# Patient Record
Sex: Male | Born: 1960
Health system: Southern US, Community
[De-identification: ages and names within clinical notes are randomized; demographics above are authoritative.]

## PROBLEM LIST (undated history)

## (undated) DIAGNOSIS — I10 Essential (primary) hypertension: Secondary | ICD-10-CM

## (undated) HISTORY — DX: Essential (primary) hypertension: I10

## (undated) HISTORY — PX: APPENDECTOMY: SHX54

## (undated) HISTORY — PX: HEMORRHOID SURGERY: SHX153

## (undated) HISTORY — PX: INGUINAL HERNIA REPAIR: SUR1180

---

## 2015-11-02 DIAGNOSIS — H43811 Vitreous degeneration, right eye: Secondary | ICD-10-CM | POA: Diagnosis not present

## 2016-01-07 DIAGNOSIS — R5383 Other fatigue: Secondary | ICD-10-CM | POA: Diagnosis not present

## 2016-01-07 DIAGNOSIS — Z23 Encounter for immunization: Secondary | ICD-10-CM | POA: Diagnosis not present

## 2016-01-07 DIAGNOSIS — E785 Hyperlipidemia, unspecified: Secondary | ICD-10-CM | POA: Diagnosis not present

## 2016-01-07 DIAGNOSIS — I10 Essential (primary) hypertension: Secondary | ICD-10-CM | POA: Diagnosis not present

## 2016-01-07 DIAGNOSIS — Z125 Encounter for screening for malignant neoplasm of prostate: Secondary | ICD-10-CM | POA: Diagnosis not present

## 2016-06-21 DIAGNOSIS — Z1389 Encounter for screening for other disorder: Secondary | ICD-10-CM | POA: Diagnosis not present

## 2016-06-21 DIAGNOSIS — N419 Inflammatory disease of prostate, unspecified: Secondary | ICD-10-CM | POA: Diagnosis not present

## 2016-08-10 DIAGNOSIS — L219 Seborrheic dermatitis, unspecified: Secondary | ICD-10-CM | POA: Diagnosis not present

## 2016-10-12 DIAGNOSIS — K649 Unspecified hemorrhoids: Secondary | ICD-10-CM | POA: Diagnosis not present

## 2016-10-12 DIAGNOSIS — K644 Residual hemorrhoidal skin tags: Secondary | ICD-10-CM | POA: Diagnosis not present

## 2016-10-12 DIAGNOSIS — K625 Hemorrhage of anus and rectum: Secondary | ICD-10-CM | POA: Diagnosis not present

## 2016-10-12 DIAGNOSIS — K648 Other hemorrhoids: Secondary | ICD-10-CM | POA: Diagnosis not present

## 2016-12-29 DIAGNOSIS — Z23 Encounter for immunization: Secondary | ICD-10-CM | POA: Diagnosis not present

## 2017-01-12 DIAGNOSIS — J029 Acute pharyngitis, unspecified: Secondary | ICD-10-CM | POA: Diagnosis not present

## 2017-01-12 DIAGNOSIS — Z6827 Body mass index (BMI) 27.0-27.9, adult: Secondary | ICD-10-CM | POA: Diagnosis not present

## 2017-01-30 DIAGNOSIS — Z1339 Encounter for screening examination for other mental health and behavioral disorders: Secondary | ICD-10-CM | POA: Diagnosis not present

## 2017-01-30 DIAGNOSIS — Z125 Encounter for screening for malignant neoplasm of prostate: Secondary | ICD-10-CM | POA: Diagnosis not present

## 2017-01-30 DIAGNOSIS — R5383 Other fatigue: Secondary | ICD-10-CM | POA: Diagnosis not present

## 2017-01-30 DIAGNOSIS — Z6827 Body mass index (BMI) 27.0-27.9, adult: Secondary | ICD-10-CM | POA: Diagnosis not present

## 2017-02-09 DIAGNOSIS — J029 Acute pharyngitis, unspecified: Secondary | ICD-10-CM | POA: Diagnosis not present

## 2017-02-09 DIAGNOSIS — Z6827 Body mass index (BMI) 27.0-27.9, adult: Secondary | ICD-10-CM | POA: Diagnosis not present

## 2017-04-27 DIAGNOSIS — R42 Dizziness and giddiness: Secondary | ICD-10-CM | POA: Diagnosis not present

## 2017-04-27 DIAGNOSIS — R531 Weakness: Secondary | ICD-10-CM | POA: Diagnosis not present

## 2017-07-13 DIAGNOSIS — Z1331 Encounter for screening for depression: Secondary | ICD-10-CM | POA: Diagnosis not present

## 2017-07-13 DIAGNOSIS — Z6827 Body mass index (BMI) 27.0-27.9, adult: Secondary | ICD-10-CM | POA: Diagnosis not present

## 2018-01-15 DIAGNOSIS — Z0000 Encounter for general adult medical examination without abnormal findings: Secondary | ICD-10-CM | POA: Diagnosis not present

## 2018-01-15 DIAGNOSIS — Z125 Encounter for screening for malignant neoplasm of prostate: Secondary | ICD-10-CM | POA: Diagnosis not present

## 2018-01-15 DIAGNOSIS — Z13220 Encounter for screening for lipoid disorders: Secondary | ICD-10-CM | POA: Diagnosis not present

## 2018-01-15 DIAGNOSIS — Z6827 Body mass index (BMI) 27.0-27.9, adult: Secondary | ICD-10-CM | POA: Diagnosis not present

## 2018-01-25 ENCOUNTER — Other Ambulatory Visit (HOSPITAL_COMMUNITY): Payer: Self-pay | Admitting: Family Medicine

## 2018-01-25 ENCOUNTER — Other Ambulatory Visit: Payer: Self-pay | Admitting: Family Medicine

## 2018-01-25 DIAGNOSIS — R079 Chest pain, unspecified: Secondary | ICD-10-CM

## 2018-03-19 DIAGNOSIS — Z6827 Body mass index (BMI) 27.0-27.9, adult: Secondary | ICD-10-CM | POA: Diagnosis not present

## 2018-03-19 DIAGNOSIS — B356 Tinea cruris: Secondary | ICD-10-CM | POA: Diagnosis not present

## 2018-04-05 DIAGNOSIS — B356 Tinea cruris: Secondary | ICD-10-CM | POA: Diagnosis not present

## 2018-04-05 DIAGNOSIS — Z6827 Body mass index (BMI) 27.0-27.9, adult: Secondary | ICD-10-CM | POA: Diagnosis not present

## 2018-06-28 DIAGNOSIS — R1013 Epigastric pain: Secondary | ICD-10-CM | POA: Diagnosis not present

## 2018-06-28 DIAGNOSIS — Z6826 Body mass index (BMI) 26.0-26.9, adult: Secondary | ICD-10-CM | POA: Diagnosis not present

## 2018-06-28 DIAGNOSIS — I10 Essential (primary) hypertension: Secondary | ICD-10-CM | POA: Diagnosis not present

## 2018-10-11 DIAGNOSIS — Z6827 Body mass index (BMI) 27.0-27.9, adult: Secondary | ICD-10-CM | POA: Diagnosis not present

## 2018-10-11 DIAGNOSIS — M545 Low back pain: Secondary | ICD-10-CM | POA: Diagnosis not present

## 2018-10-11 DIAGNOSIS — I10 Essential (primary) hypertension: Secondary | ICD-10-CM | POA: Diagnosis not present

## 2018-11-06 DIAGNOSIS — Z6827 Body mass index (BMI) 27.0-27.9, adult: Secondary | ICD-10-CM | POA: Diagnosis not present

## 2018-11-06 DIAGNOSIS — M954 Acquired deformity of chest and rib: Secondary | ICD-10-CM | POA: Diagnosis not present

## 2018-11-06 DIAGNOSIS — G8929 Other chronic pain: Secondary | ICD-10-CM | POA: Diagnosis not present

## 2018-11-06 DIAGNOSIS — Z1331 Encounter for screening for depression: Secondary | ICD-10-CM | POA: Diagnosis not present

## 2018-11-06 DIAGNOSIS — M549 Dorsalgia, unspecified: Secondary | ICD-10-CM | POA: Diagnosis not present

## 2018-11-15 DIAGNOSIS — M549 Dorsalgia, unspecified: Secondary | ICD-10-CM | POA: Diagnosis not present

## 2018-11-15 DIAGNOSIS — M2560 Stiffness of unspecified joint, not elsewhere classified: Secondary | ICD-10-CM | POA: Diagnosis not present

## 2018-11-19 DIAGNOSIS — M2560 Stiffness of unspecified joint, not elsewhere classified: Secondary | ICD-10-CM | POA: Diagnosis not present

## 2018-11-19 DIAGNOSIS — M549 Dorsalgia, unspecified: Secondary | ICD-10-CM | POA: Diagnosis not present

## 2019-01-04 DIAGNOSIS — Z6827 Body mass index (BMI) 27.0-27.9, adult: Secondary | ICD-10-CM | POA: Diagnosis not present

## 2019-01-04 DIAGNOSIS — G8929 Other chronic pain: Secondary | ICD-10-CM | POA: Diagnosis not present

## 2019-01-04 DIAGNOSIS — M549 Dorsalgia, unspecified: Secondary | ICD-10-CM | POA: Diagnosis not present

## 2019-01-18 DIAGNOSIS — Z23 Encounter for immunization: Secondary | ICD-10-CM | POA: Diagnosis not present

## 2019-01-29 DIAGNOSIS — M545 Low back pain: Secondary | ICD-10-CM | POA: Diagnosis not present

## 2019-02-07 DIAGNOSIS — M545 Low back pain: Secondary | ICD-10-CM | POA: Diagnosis not present

## 2019-02-07 DIAGNOSIS — M6281 Muscle weakness (generalized): Secondary | ICD-10-CM | POA: Diagnosis not present

## 2019-02-12 DIAGNOSIS — R079 Chest pain, unspecified: Secondary | ICD-10-CM | POA: Diagnosis not present

## 2019-02-12 DIAGNOSIS — J00 Acute nasopharyngitis [common cold]: Secondary | ICD-10-CM | POA: Diagnosis not present

## 2019-02-12 DIAGNOSIS — Z6828 Body mass index (BMI) 28.0-28.9, adult: Secondary | ICD-10-CM | POA: Diagnosis not present

## 2019-02-21 DIAGNOSIS — M545 Low back pain: Secondary | ICD-10-CM | POA: Diagnosis not present

## 2019-02-21 DIAGNOSIS — M6281 Muscle weakness (generalized): Secondary | ICD-10-CM | POA: Diagnosis not present

## 2019-03-01 DIAGNOSIS — Z125 Encounter for screening for malignant neoplasm of prostate: Secondary | ICD-10-CM | POA: Diagnosis not present

## 2019-03-01 DIAGNOSIS — K921 Melena: Secondary | ICD-10-CM | POA: Diagnosis not present

## 2019-03-01 DIAGNOSIS — R3915 Urgency of urination: Secondary | ICD-10-CM | POA: Diagnosis not present

## 2019-03-01 DIAGNOSIS — Z1331 Encounter for screening for depression: Secondary | ICD-10-CM | POA: Diagnosis not present

## 2019-03-01 DIAGNOSIS — Z79899 Other long term (current) drug therapy: Secondary | ICD-10-CM | POA: Diagnosis not present

## 2019-03-01 DIAGNOSIS — Z6828 Body mass index (BMI) 28.0-28.9, adult: Secondary | ICD-10-CM | POA: Diagnosis not present

## 2019-03-01 DIAGNOSIS — E7800 Pure hypercholesterolemia, unspecified: Secondary | ICD-10-CM | POA: Diagnosis not present

## 2019-03-07 DIAGNOSIS — M6281 Muscle weakness (generalized): Secondary | ICD-10-CM | POA: Diagnosis not present

## 2019-03-07 DIAGNOSIS — M545 Low back pain: Secondary | ICD-10-CM | POA: Diagnosis not present

## 2019-03-15 DIAGNOSIS — K625 Hemorrhage of anus and rectum: Secondary | ICD-10-CM | POA: Diagnosis not present

## 2019-03-29 ENCOUNTER — Ambulatory Visit (INDEPENDENT_AMBULATORY_CARE_PROVIDER_SITE_OTHER): Payer: BC Managed Care – PPO | Admitting: Cardiology

## 2019-03-29 ENCOUNTER — Encounter: Payer: Self-pay | Admitting: Cardiology

## 2019-03-29 ENCOUNTER — Other Ambulatory Visit: Payer: Self-pay

## 2019-03-29 DIAGNOSIS — R0789 Other chest pain: Secondary | ICD-10-CM | POA: Diagnosis not present

## 2019-03-29 DIAGNOSIS — R079 Chest pain, unspecified: Secondary | ICD-10-CM

## 2019-03-29 DIAGNOSIS — I10 Essential (primary) hypertension: Secondary | ICD-10-CM | POA: Insufficient documentation

## 2019-03-29 MED ORDER — NITROGLYCERIN 0.4 MG SL SUBL: 0.4000 mg | SUBLINGUAL_TABLET | SUBLINGUAL | 6 refills | Status: DC | PRN

## 2019-03-29 NOTE — Progress Notes (Signed)
Cardiology Office Note:    Date:  03/29/2019   ID:  Brandon Love, DOB [birth year], MRN [id number]  PCP:  Noni Saupe, MD  Cardiologist:  Garwin Brothers, MD   Referring MD: Noni Saupe, MD    ASSESSMENT:    1. Chest discomfort   2. Essential hypertension    PLAN:    In order of problems listed above:  1. Chest discomfort: I discussed my findings with the patient at extensive length.  They are atypical for coronary etiology however in view of risk factors I discussed with him the following.  Sublingual nitroglycerin prescription was sent, its protocol and 911 protocol explained and the patient vocalized understanding questions were answered to the patient's satisfaction.  In view of the symptoms I will also do a Lexiscan sestamibi.  Procedure, benefits and risks explained to the patient and he vocalized understanding and is agreeable. 2. Essential hypertension: Blood pressure is stable and he is monitored closely by primary care physician.  I reviewed primary care records extensively and his lipids are unremarkable. 3. Patient will be seen in follow-up appointment in 6 months or earlier if the patient has any concerns.  He knows to go to nearest emergency room for any concerning symptoms.   Medication Adjustments/Labs and Tests Ordered: Current medicines are reviewed at length with the patient today.  Concerns regarding medicines are outlined above.  No orders of the defined types were placed in this encounter.  No orders of the defined types were placed in this encounter.    History of Present Illness:    Brandon Love is a 58 y.o. male who is being seen today for the evaluation of chest discomfort at the request of Noni Saupe, MD.  Patient is a pleasant 58 year old male.  He has past medical history of essential hypertension.  The patient mentions to me that occasionally has chest discomfort and this is not related to exertion.  It is more of stabbing-like  nature no radiation to the neck or to the arms.  He works full-time.  He does not exercise on a regular basis.  He is not sexually active.  Therefore history of exertional induced symptoms is difficult to elicit.  No orthopnea or PND.  At the time of my evaluation, the patient is alert awake oriented and in no distress.  No past medical history on file.   Current Medications: Current Meds  Medication Sig  . fluticasone (FLONASE) 50 MCG/ACT nasal spray Place 2 sprays into both nostrils daily.  . Garlic 100 MG TABS Take by mouth.  Marland Kitchen lisinopril (ZESTRIL) 20 MG tablet Take 20 mg by mouth daily.  . magnesium oxide (MAG-OX) 400 MG tablet Take 400 mg by mouth daily.  . Omega-3 Fatty Acids (FISH OIL) 1000 MG CAPS Take 1 capsule by mouth.     Allergies:   Codeine   Social History   Socioeconomic History  . Marital status: Unknown    Spouse name: Not on file  . Number of children: Not on file  . Years of education: Not on file  . Highest education level: Not on file  Occupational History  . Not on file  Tobacco Use  . Smoking status: Never Smoker  . Smokeless tobacco: Never Used  Substance and Sexual Activity  . Alcohol use: Not Currently  . Drug use: Never  . Sexual activity: Not on file  Other Topics Concern  . Not on file  Social History  Narrative  . Not on file   Social Determinants of Health   Financial Resource Strain:   . Difficulty of Paying Living Expenses: Not on file  Food Insecurity:   . Worried About Charity fundraiser in the Last Year: Not on file  . Ran Out of Food in the Last Year: Not on file  Transportation Needs:   . Lack of Transportation (Medical): Not on file  . Lack of Transportation (Non-Medical): Not on file  Physical Activity:   . Days of Exercise per Week: Not on file  . Minutes of Exercise per Session: Not on file  Stress:   . Feeling of Stress : Not on file  Social Connections:   . Frequency of Communication with Friends and Family: Not on  file  . Frequency of Social Gatherings with Friends and Family: Not on file  . Attends Religious Services: Not on file  . Active Member of Clubs or Organizations: Not on file  . Attends Archivist Meetings: Not on file  . Marital Status: Not on file     Family History: The patient's family history includes Headache in his mother; Prostate cancer in his father.  ROS:   Please see the history of present illness.    All other systems reviewed and are negative.  EKGs/Labs/Other Studies Reviewed:    The following studies were reviewed today: EKG reveals sinus rhythm and nonspecific ST-T changes.   Recent Labs: No results found for requested labs within last 8760 hours.  Recent Lipid Panel No results found for: CHOL, TRIG, HDL, CHOLHDL, VLDL, LDLCALC, LDLDIRECT  Physical Exam:    VS:  BP 140/72   Pulse 76   Ht 5\' 9"  (1.753 m)   Wt 191 lb (86.6 kg)   SpO2 98%   BMI 28.21 kg/m     Wt Readings from Last 3 Encounters:  03/29/19 191 lb (86.6 kg)     GEN: Patient is in no acute distress HEENT: Normal NECK: No JVD; No carotid bruits LYMPHATICS: No lymphadenopathy CARDIAC: S1 S2 regular, 2/6 systolic murmur at the apex. RESPIRATORY:  Clear to auscultation without rales, wheezing or rhonchi  ABDOMEN: Soft, non-tender, non-distended MUSCULOSKELETAL:  No edema; No deformity  SKIN: Warm and dry NEUROLOGIC:  Alert and oriented x 3 PSYCHIATRIC:  Normal affect    Signed, Jenean Lindau, MD  03/29/2019 2:29 PM    [place]

## 2019-03-29 NOTE — Addendum Note (Signed)
Addended by: Aleatha Borer on: 03/29/2019 02:40 PM   Modules accepted: Orders

## 2019-03-29 NOTE — Addendum Note (Signed)
Addended by: Ashok Norris on: 03/29/2019 02:48 PM   Modules accepted: Orders

## 2019-03-29 NOTE — Patient Instructions (Signed)
Medication Instructions:  Your physician has recommended you make the following change in your medication:  Nitroglycerine 0.4 mg under the tongue as needed for chest pain. Take 1 tablet every 15 minutes up to 3 tablets if pain continues. If pain continues after 3rd tablet call 911 and proceed to the nearest ER.  *If you need a refill on your cardiac medications before your next appointment, please call your pharmacy*  Lab Work: None ordered If you have labs (blood work) drawn today and your tests are completely normal, you will receive your results only by: Marland Kitchen MyChart Message (if you have MyChart) OR . A paper copy in the mail If you have any lab test that is abnormal or we need to change your treatment, we will call you to review the results.  Testing/Procedures: Your physician has requested that you have a lexiscan myoview. For further information please visit [email or web address]. Please follow instruction sheet, as given.  Follow-Up: At [facility], you and your health needs are our priority.  As part of our continuing mission to provide you with exceptional heart care, we have created designated Provider Care Teams.  These Care Teams include your primary Cardiologist (physician) and Advanced Practice Providers (APPs -  Physician Assistants and Nurse Practitioners) who all work together to provide you with the care you need, when you need it.  Your next appointment:   6 month(s)  The format for your next appointment:   In Person  Provider:   You will see  Sunny Schlein Revankar.  Or, you can be scheduled with the following Advanced Practice Provider on your designated Care Team (at our [facility]):  Laurann Montana, FNP

## 2019-04-02 ENCOUNTER — Telehealth (HOSPITAL_COMMUNITY): Payer: Self-pay | Admitting: *Deleted

## 2019-04-02 NOTE — Telephone Encounter (Signed)
Left message on voicemail in reference to upcoming appointment scheduled for 04/03/19. Phone number given for a call back so details instructions can be given.  Brandon Love Jacqueline   

## 2019-04-03 ENCOUNTER — Ambulatory Visit (INDEPENDENT_AMBULATORY_CARE_PROVIDER_SITE_OTHER): Payer: BC Managed Care – PPO

## 2019-04-03 ENCOUNTER — Other Ambulatory Visit: Payer: Self-pay

## 2019-04-03 VITALS — Ht 69.0 in | Wt 191.0 lb

## 2019-04-03 DIAGNOSIS — R079 Chest pain, unspecified: Secondary | ICD-10-CM | POA: Diagnosis not present

## 2019-04-03 DIAGNOSIS — R0789 Other chest pain: Secondary | ICD-10-CM

## 2019-04-03 DIAGNOSIS — I10 Essential (primary) hypertension: Secondary | ICD-10-CM

## 2019-04-03 LAB — MYOCARDIAL PERFUSION IMAGING
LV dias vol: 93 mL (ref 62–150)
LV sys vol: 36 mL
Peak HR: 99 {beats}/min
Rest HR: 70 {beats}/min
SDS: 0
SRS: 0
SSS: 0
TID: 1.04

## 2019-04-03 MED ORDER — TECHNETIUM TC 99M TETROFOSMIN IV KIT
10.6000 | PACK | Freq: Once | INTRAVENOUS | Status: AC | PRN
  Administered 2019-04-03: 10.6 via INTRAVENOUS

## 2019-04-03 MED ORDER — TECHNETIUM TC 99M TETROFOSMIN IV KIT
32.8000 | PACK | Freq: Once | INTRAVENOUS | Status: AC | PRN
  Administered 2019-04-03: 32.8 via INTRAVENOUS

## 2019-04-03 MED ORDER — REGADENOSON 0.4 MG/5ML IV SOLN
0.4000 mg | Freq: Once | INTRAVENOUS | Status: AC
  Administered 2019-04-03: 0.4 mg via INTRAVENOUS

## 2019-04-05 ENCOUNTER — Telehealth: Payer: Self-pay | Admitting: *Deleted

## 2019-04-05 NOTE — Telephone Encounter (Signed)
Telephone call to patient. Made appointment for 1st available 04/24/19.Patient wants [place] only. Nitro called in to pharmacy.

## 2019-04-05 NOTE — Telephone Encounter (Signed)
-----   Message from Jenean Lindau, MD sent at 04/03/2019  5:03 PM EST ----- Stress test is abnormal.  Please make an appointment for patient to see me in the next few days.  Make sure patient has nitroglycerin prescription. Jenean Lindau, MD 04/03/2019 5:02 PM

## 2019-04-09 ENCOUNTER — Other Ambulatory Visit: Payer: Self-pay

## 2019-04-09 ENCOUNTER — Ambulatory Visit (HOSPITAL_BASED_OUTPATIENT_CLINIC_OR_DEPARTMENT_OTHER)
Admission: RE | Admit: 2019-04-09 | Discharge: 2019-04-09 | Disposition: A | Discharge status: Home / Self Care | Payer: BC Managed Care – PPO | Source: Ambulatory Visit | Attending: Cardiology | Admitting: Cardiology

## 2019-04-09 ENCOUNTER — Ambulatory Visit (INDEPENDENT_AMBULATORY_CARE_PROVIDER_SITE_OTHER): Payer: BC Managed Care – PPO | Admitting: Cardiology

## 2019-04-09 ENCOUNTER — Encounter: Payer: Self-pay | Admitting: Cardiology

## 2019-04-09 VITALS — BP 128/78 | HR 82 | Ht 69.0 in | Wt 192.0 lb

## 2019-04-09 DIAGNOSIS — R0789 Other chest pain: Secondary | ICD-10-CM | POA: Diagnosis not present

## 2019-04-09 DIAGNOSIS — I10 Essential (primary) hypertension: Secondary | ICD-10-CM

## 2019-04-09 DIAGNOSIS — R9439 Abnormal result of other cardiovascular function study: Secondary | ICD-10-CM | POA: Diagnosis not present

## 2019-04-09 MED ORDER — ASPIRIN EC 81 MG PO TBEC: 81.0000 mg | DELAYED_RELEASE_TABLET | Freq: Every day | ORAL | 3 refills | Status: AC

## 2019-04-09 MED ORDER — NITROGLYCERIN 0.4 MG SL SUBL: 0.4000 mg | SUBLINGUAL_TABLET | SUBLINGUAL | 6 refills | Status: AC | PRN

## 2019-04-09 NOTE — Patient Instructions (Addendum)
Medication Instructions:  Your physician has recommended you make the following change in your medication:   START taking aspirin 81 mg (1 tablet) once daily  *If you need a refill on your cardiac medications before your next appointment, please call your pharmacy*  Lab Work: Your physician recommends that you have a BMP and CBC If you have labs (blood work) drawn today and your tests are completely normal, you will receive your results only by: Marland Kitchen MyChart Message (if you have MyChart) OR . A paper copy in the mail If you have any lab test that is abnormal or we need to change your treatment, we will call you to review the results.  Testing/Procedures: You had an EKG performed today  A chest x-ray takes a picture of the organs and structures inside the chest, including the heart, lungs, and blood vessels. This test can show several things, including, whether the heart is enlarges; whether fluid is building up in the lungs; and whether pacemaker / defibrillator leads are still in place.  Your physician has requested that you have a cardiac catheterization. Cardiac catheterization is used to diagnose and/or treat various heart conditions. Doctors may recommend this procedure for a number of different reasons. The most common reason is to evaluate chest pain. Chest pain can be a symptom of coronary artery disease (CAD), and cardiac catheterization can show whether plaque is narrowing or blocking your heart's arteries. This procedure is also used to evaluate the valves, as well as measure the blood flow and oxygen levels in different parts of your heart. For further information please visit [email or web address] Please follow instruction sheet, as given.   YOU ARE SCHEDULED FOR CVD19 screening on 04/15/2019 at 1120 AM. YOU will need to get in the prescreening line     [facility] [place] [address], [address] [place] [address] Dept: [phone] Loc: [phone]  RENNER SEBALD  04/09/2019  You are scheduled for a Cardiac Catheterization on Thursday, December 31 with Dr. Tonny Bollman.  1. Please arrive at the [facility] (Main Entrance A) at [facility]: [address] [place], [place] [address] at 8:30 AM (This time is two hours before your procedure to ensure your preparation). Free valet parking service is available.   Special note: Every effort is made to have your procedure done on time. Please understand that emergencies sometimes delay scheduled procedures.  2. Diet: Do not eat solid foods after midnight.  The patient may have clear liquids until 5am upon the day of the procedure.  3. Labs: NONE  4. Medication instructions in preparation for your procedure:   Contrast Allergy: No    Stop taking, Lisinopril (Zestril or Prinivil) Wednesday, December 30, before 8 AM   On the morning of your procedure, take your Aspirin and any morning medicines NOT listed above.  You may use sips of water.  5. Plan for one night stay--bring personal belongings. 6. Bring a current list of your medications and current insurance cards. 7. You MUST have a responsible person to drive you home. 8. Someone MUST be with you the first 24 hours after you arrive home or your discharge will be delayed. 9. Please wear clothes that are easy to get on and off and wear slip-on shoes.  Thank you for allowing [place] to care for you!   -- [facility]   Follow-Up: At [facility], you and your health  needs are our priority.  As part of our continuing mission to provide you with exceptional heart care, we have created designated Provider Care Teams.  These Care Teams include your primary Cardiologist (physician) and Advanced Practice Providers (APPs -  Physician Assistants and Nurse Practitioners) who all work together to provide you with the care you need, when you need  it.  Your next appointment:   1 month(s)  The format for your next appointment:   In Person  Provider:   Belva Crome, MD  Other Instructions  Coronary Angiogram A coronary angiogram is an X-ray procedure that is used to examine the arteries in the heart. In this procedure, a dye (contrast dye) is injected through a long, thin tube (catheter). The catheter is inserted through the groin, wrist, or arm. The dye is injected into each artery, then X-rays are taken to show if there is a blockage in the arteries of the heart. This procedure can also show if you have valve disease or a disease of the aorta, and it can be used to check the overall function of your heart muscle. You may have a coronary angiogram if:  You are having chest pain, or other symptoms of angina, and you are at risk for heart disease.  You have an abnormal electrocardiogram (ECG) or stress test.  You have chest pain and heart failure.  You are having irregular heart rhythms.  You and your health care provider determine that the benefits of the test information outweigh the risks of the procedure. Let your health care provider know about:  Any allergies you have, including allergies to contrast dye.  All medicines you are taking, including vitamins, herbs, eye drops, creams, and over-the-counter medicines.  Any problems you or family members have had with anesthetic medicines.  Any blood disorders you have.  Any surgeries you have had.  History of kidney problems or kidney failure.  Any medical conditions you have.  Whether you are pregnant or may be pregnant. What are the risks? Generally, this is a safe procedure. However, problems may occur, including:  Infection.  Allergic reaction to medicines or dyes that are used.  Bleeding from the access site or other locations.  Kidney injury, especially in people with impaired kidney function.  Stroke (rare).  Heart attack (rare).  Damage to other  structures or organs. What happens before the procedure? Staying hydrated Follow instructions from your health care provider about hydration, which may include:  Up to 2 hours before the procedure - you may continue to drink clear liquids, such as water, clear fruit juice, black coffee, and plain tea. Eating and drinking restrictions Follow instructions from your health care provider about eating and drinking, which may include:  8 hours before the procedure - stop eating heavy meals or foods such as meat, fried foods, or fatty foods.  6 hours before the procedure - stop eating light meals or foods, such as toast or cereal.  2 hours before the procedure - stop drinking clear liquids. General instructions  Ask your health care provider about: ? Changing or stopping your regular medicines. This is especially important if you are taking diabetes medicines or blood thinners. ? Taking medicines such as ibuprofen. These medicines can thin your blood. Do not take these medicines before your procedure if your health care provider instructs you not to, though aspirin may be recommended prior to coronary angiograms.  Plan to have someone take you home from the hospital or clinic.  You may  need to have blood tests or X-rays done. What happens during the procedure?  An IV tube will be inserted into one of your veins.  You will be given one or more of the following: ? A medicine to help you relax (sedative). ? A medicine to numb the area where the catheter will be inserted into an artery (local anesthetic).  To reduce your risk of infection: ? Your health care team will wash or sanitize their hands. ? Your skin will be washed with soap. ? Hair may be removed from the area where the catheter will be inserted.  You will be connected to a continuous ECG monitor.  The catheter will be inserted into an artery. The location may be in your groin, in your wrist, or in the fold of your arm (near your  elbow).  A type of X-ray (fluoroscopy) will be used to help guide the catheter to the opening of the blood vessel that is being examined.  A dye will be injected into the catheter, and X-rays will be taken. The dye will help to show where any narrowing or blockages are located in the heart arteries.  Tell your health care provider if you have any chest pain or trouble breathing during the procedure.  If blockages are found, your health care provider may perform another procedure, such as inserting a coronary stent. The procedure may vary among health care providers and hospitals. What happens after the procedure?  After the procedure, you will need to keep the area still for a few hours, or for as long as told by your health care provider. If the procedure is done through the groin, you will be instructed to not bend and not cross your legs.  The insertion site will be checked frequently.  The pulse in your foot or wrist will be checked frequently.  You may have additional blood tests, X-rays, and a test that records the electrical activity of your heart (ECG).  Do not drive for 24 hours if you were given a sedative. Summary  A coronary angiogram is an X-ray procedure that is used to look into the arteries in the heart.  During the procedure, a dye (contrast dye) is injected through a long, thin tube (catheter). The catheter is inserted through the groin, wrist, or arm.  Tell your health care provider about any allergies you have, including allergies to contrast dye.  After the procedure, you will need to keep the area still for a few hours, or for as long as told by your health care provider. This information is not intended to replace advice given to you by your health care provider. Make sure you discuss any questions you have with your health care provider. Document Released: 10/09/2002 Document Revised: 03/17/2017 Document Reviewed: 01/15/2016 Elsevier Patient Education  [place].   Aspirin and Your Heart  Aspirin is a medicine that prevents the cells in the blood that are used for clotting, called platelets, from sticking together. Aspirin can be used to help reduce the risk of blood clots, heart attacks, and other heart-related problems. Can I take aspirin? Your health care provider will help you determine whether it is safe and beneficial for you to take aspirin daily. Taking aspirin daily may be helpful if you:  Have had a heart attack or chest pain.  Are at risk for a heart attack.  Have undergone open-heart surgery, such as coronary artery bypass surgery (CABG).  Have had coronary angioplasty or a stent.  Have had certain types of stroke or transient ischemic attack (TIA).  Have peripheral artery disease (PAD).  Have chronic heart rhythm problems such as atrial fibrillation and cannot take an anticoagulant.  Have valve disease or have had surgery on a valve. What are the risks? Daily use of aspirin can cause side effects. Some of these include:  Bleeding. Bleeding problems can be minor or serious. An example of a minor problem is a cut that does not stop bleeding. An example of a more serious problem is stomach bleeding or, rarely, bleeding into the brain. Your risk of bleeding is increased if you are also taking non-steroidal anti-inflammatory drugs (NSAIDs).  Increased bruising.  Upset stomach.  An allergic reaction. People who have nasal polyps have an increased risk of developing an aspirin allergy. General guidelines  Take aspirin only as told by your health care provider. Make sure that you understand how much you should take and what form you should take. The two forms of aspirin are: ? Non-enteric-coated.This type of aspirin does not have a coating and is absorbed quickly. This type of aspirin also comes in a chewable form. ? Enteric-coated. This type of aspirin has a coating that releases the medicine very slowly.  Enteric-coated aspirin might cause less stomach upset than non-enteric-coated aspirin. This type of aspirin should not be chewed or crushed.  Limit alcohol intake to no more than 1 drink a day for nonpregnant women and 2 drinks a day for men. Drinking alcohol increases your risk of bleeding. One drink equals 12 oz of beer, 5 oz of wine, or 1 oz of hard liquor. Contact a health care provider if you:  Have unusual bleeding or bruising.  Have stomach pain or nausea.  Have ringing in your ears.  Have an allergic reaction that causes: ? Hives. ? Itchy skin. ? Swelling of the lips, tongue, or face. Get help right away if you:  Notice that your bowel movements are bloody, dark red, or black in color.  Vomit or cough up blood.  Have blood in your urine.  Cough, have noisy breathing (wheeze), or feel short of breath.  Have chest pain, especially if the pain spreads to the arms, back, neck, or jaw.  Have a severe headache, or a headache with confusion, or dizziness. These symptoms may represent a serious problem that is an emergency. Do not wait to see if the symptoms will go away. Get medical help right away. Call your local emergency services (911 in the [place] Do not drive yourself to the hospital. Summary  Aspirin can be used to help reduce the risk of blood clots, heart attacks, and other heart-related problems.  Daily use of aspirin can increase your risk of side effects. Your health care provider will help you determine whether it is safe and beneficial for you to take aspirin daily.  Take aspirin only as told by your health care provider. Make sure that you understand how much you can take and what form you can take. This information is not intended to replace advice given to you by your health care provider. Make sure you discuss any questions you have with your health care provider. Document Released: 03/17/2008 Document Revised: 02/02/2017 Document Reviewed:  02/02/2017 Elsevier Patient Education  2020 [employer]

## 2019-04-09 NOTE — H&P (View-Only) (Signed)
Cardiology Office Note:    Date:  04/09/2019   ID:  Brandon Love, DOB 04/03/1961, MRN [id number]  PCP:  Angelina Sheriff, MD  Cardiologist:  Jenean Lindau, MD   Referring MD: Angelina Sheriff, MD    ASSESSMENT:    1. Chest discomfort   2. Essential hypertension   3. Abnormal nuclear stress test    PLAN:    In order of problems listed above:  1. Nuclear stress test: Patient symptoms are concerning and his stress test correlates with his symptoms and needs further evaluation.  I discussed this with him at extensive length.I discussed coronary angiography and left heart catheterization with the patient at extensive length. Procedure, benefits and potential risks were explained. Patient had multiple questions which were answered to the patient's satisfaction. Patient agreed and consented for the procedure. Further recommendations will be made based on the findings of the coronary angiography. In the interim. The patient has any significant symptoms he knows to go to the nearest emergency room. 2. Sublingual nitroglycerin prescription was sent, its protocol and 911 protocol explained and the patient vocalized understanding questions were answered to the patient's satisfaction.  He was also advised to take a coated aspirin on a daily basis. 3. Essential hypertension: Blood pressure stable.  Further recommendations will be made based on the findings of the coronary angiography.  He knows to go to the nearest emergency room for any significant concerns.   Medication Adjustments/Labs and Tests Ordered: Current medicines are reviewed at length with the patient today.  Concerns regarding medicines are outlined above.  No orders of the defined types were placed in this encounter.  No orders of the defined types were placed in this encounter.    Chief Complaint  Patient presents with  . Follow-up    Follow up from Stress test      History of Present Illness:    Brandon Love is a  58 y.o. male.  Patient has past medical history of essential hypertension.  He mentions to me that he had chest tightness whenever he is stressed.  He mentions that it radiates to the left shoulder and the left arm.  For this reason he was here for evaluation.  His stress test is abnormal and the details are mentioned below and the patient was called for follow-up.  At the time of my evaluation, the patient is alert awake oriented and in no distress.  No past medical history on file.   Current Medications: Current Meds  Medication Sig  . fluticasone (FLONASE) 50 MCG/ACT nasal spray Place 2 sprays into both nostrils daily.  . Garlic 829 MG TABS Take by mouth.  Marland Kitchen lisinopril (ZESTRIL) 20 MG tablet Take 20 mg by mouth daily.  . magnesium oxide (MAG-OX) 400 MG tablet Take 400 mg by mouth daily.  . nitroGLYCERIN (NITROSTAT) 0.4 MG SL tablet Place 1 tablet (0.4 mg total) under the tongue every 5 (five) minutes as needed for chest pain.  . Omega-3 Fatty Acids (FISH OIL) 1000 MG CAPS Take 1 capsule by mouth.     Allergies:   Codeine   Social History   Socioeconomic History  . Marital status: Unknown    Spouse name: Not on file  . Number of children: Not on file  . Years of education: Not on file  . Highest education level: Not on file  Occupational History  . Not on file  Tobacco Use  . Smoking status: Never Smoker  .  Smokeless tobacco: Never Used  Substance and Sexual Activity  . Alcohol use: Not Currently  . Drug use: Never  . Sexual activity: Not on file  Other Topics Concern  . Not on file  Social History Narrative  . Not on file   Social Determinants of Health   Financial Resource Strain:   . Difficulty of Paying Living Expenses: Not on file  Food Insecurity:   . Worried About Programme researcher, broadcasting/film/video in the Last Year: Not on file  . Ran Out of Food in the Last Year: Not on file  Transportation Needs:   . Lack of Transportation (Medical): Not on file  . Lack of Transportation  (Non-Medical): Not on file  Physical Activity:   . Days of Exercise per Week: Not on file  . Minutes of Exercise per Session: Not on file  Stress:   . Feeling of Stress : Not on file  Social Connections:   . Frequency of Communication with Friends and Family: Not on file  . Frequency of Social Gatherings with Friends and Family: Not on file  . Attends Religious Services: Not on file  . Active Member of Clubs or Organizations: Not on file  . Attends Banker Meetings: Not on file  . Marital Status: Not on file     Family History: The patient's family history includes Headache in his mother; Prostate cancer in his father.  ROS:   Please see the history of present illness.    All other systems reviewed and are negative.  EKGs/Labs/Other Studies Reviewed:    The following studies were reviewed today:  EKG done today was within normal limits. Study Highlights   The left ventricular ejection fraction is normal (55-65%).  Nuclear stress EF: 62%.  There was no ST segment deviation noted during stress.  Defect 1: There is a small defect of mild severity present in the basal inferior and mid inferior location.  Findings consistent with ischemia.  This is an intermediate risk study.  Small area of mild ischemia involving mid and basal portion of the inferior wall.  Normal EF.        Recent Labs: No results found for requested labs within last 8760 hours.  Recent Lipid Panel No results found for: CHOL, TRIG, HDL, CHOLHDL, VLDL, LDLCALC, LDLDIRECT  Physical Exam:    VS:  BP 128/78 (BP Location: Right Arm, Patient Position: Sitting, Cuff Size: Normal)   Pulse 82   Ht 5\' 9"  (1.753 m)   Wt 192 lb (87.1 kg)   SpO2 97%   BMI 28.35 kg/m     Wt Readings from Last 3 Encounters:  04/09/19 192 lb (87.1 kg)  04/03/19 191 lb (86.6 kg)  03/29/19 191 lb (86.6 kg)     GEN: Patient is in no acute distress HEENT: Normal NECK: No JVD; No carotid  bruits LYMPHATICS: No lymphadenopathy CARDIAC: Hear sounds regular, 2/6 systolic murmur at the apex. RESPIRATORY:  Clear to auscultation without rales, wheezing or rhonchi  ABDOMEN: Soft, non-tender, non-distended MUSCULOSKELETAL:  No edema; No deformity  SKIN: Warm and dry NEUROLOGIC:  Alert and oriented x 3 PSYCHIATRIC:  Normal affect   Signed, 14/11/20, MD  04/09/2019 2:28 PM    [facility]

## 2019-04-09 NOTE — Progress Notes (Signed)
Cardiology Office Note:    Date:  04/09/2019   ID:  Brandon Love, DOB 04/03/1961, MRN [id number]  PCP:  Angelina Sheriff, MD  Cardiologist:  Jenean Lindau, MD   Referring MD: Angelina Sheriff, MD    ASSESSMENT:    1. Chest discomfort   2. Essential hypertension   3. Abnormal nuclear stress test    PLAN:    In order of problems listed above:  1. Nuclear stress test: Patient symptoms are concerning and his stress test correlates with his symptoms and needs further evaluation.  I discussed this with him at extensive length.I discussed coronary angiography and left heart catheterization with the patient at extensive length. Procedure, benefits and potential risks were explained. Patient had multiple questions which were answered to the patient's satisfaction. Patient agreed and consented for the procedure. Further recommendations will be made based on the findings of the coronary angiography. In the interim. The patient has any significant symptoms he knows to go to the nearest emergency room. 2. Sublingual nitroglycerin prescription was sent, its protocol and 911 protocol explained and the patient vocalized understanding questions were answered to the patient's satisfaction.  He was also advised to take a coated aspirin on a daily basis. 3. Essential hypertension: Blood pressure stable.  Further recommendations will be made based on the findings of the coronary angiography.  He knows to go to the nearest emergency room for any significant concerns.   Medication Adjustments/Labs and Tests Ordered: Current medicines are reviewed at length with the patient today.  Concerns regarding medicines are outlined above.  No orders of the defined types were placed in this encounter.  No orders of the defined types were placed in this encounter.    Chief Complaint  Patient presents with  . Follow-up    Follow up from Stress test      History of Present Illness:    Brandon Love is a  58 y.o. male.  Patient has past medical history of essential hypertension.  He mentions to me that he had chest tightness whenever he is stressed.  He mentions that it radiates to the left shoulder and the left arm.  For this reason he was here for evaluation.  His stress test is abnormal and the details are mentioned below and the patient was called for follow-up.  At the time of my evaluation, the patient is alert awake oriented and in no distress.  No past medical history on file.   Current Medications: Current Meds  Medication Sig  . fluticasone (FLONASE) 50 MCG/ACT nasal spray Place 2 sprays into both nostrils daily.  . Garlic 829 MG TABS Take by mouth.  Marland Kitchen lisinopril (ZESTRIL) 20 MG tablet Take 20 mg by mouth daily.  . magnesium oxide (MAG-OX) 400 MG tablet Take 400 mg by mouth daily.  . nitroGLYCERIN (NITROSTAT) 0.4 MG SL tablet Place 1 tablet (0.4 mg total) under the tongue every 5 (five) minutes as needed for chest pain.  . Omega-3 Fatty Acids (FISH OIL) 1000 MG CAPS Take 1 capsule by mouth.     Allergies:   Codeine   Social History   Socioeconomic History  . Marital status: Unknown    Spouse name: Not on file  . Number of children: Not on file  . Years of education: Not on file  . Highest education level: Not on file  Occupational History  . Not on file  Tobacco Use  . Smoking status: Never Smoker  .  Smokeless tobacco: Never Used  Substance and Sexual Activity  . Alcohol use: Not Currently  . Drug use: Never  . Sexual activity: Not on file  Other Topics Concern  . Not on file  Social History Narrative  . Not on file   Social Determinants of Health   Financial Resource Strain:   . Difficulty of Paying Living Expenses: Not on file  Food Insecurity:   . Worried About Programme researcher, broadcasting/film/video in the Last Year: Not on file  . Ran Out of Food in the Last Year: Not on file  Transportation Needs:   . Lack of Transportation (Medical): Not on file  . Lack of Transportation  (Non-Medical): Not on file  Physical Activity:   . Days of Exercise per Week: Not on file  . Minutes of Exercise per Session: Not on file  Stress:   . Feeling of Stress : Not on file  Social Connections:   . Frequency of Communication with Friends and Family: Not on file  . Frequency of Social Gatherings with Friends and Family: Not on file  . Attends Religious Services: Not on file  . Active Member of Clubs or Organizations: Not on file  . Attends Banker Meetings: Not on file  . Marital Status: Not on file     Family History: The patient's family history includes Headache in his mother; Prostate cancer in his father.  ROS:   Please see the history of present illness.    All other systems reviewed and are negative.  EKGs/Labs/Other Studies Reviewed:    The following studies were reviewed today:  EKG done today was within normal limits. Study Highlights   The left ventricular ejection fraction is normal (55-65%).  Nuclear stress EF: 62%.  There was no ST segment deviation noted during stress.  Defect 1: There is a small defect of mild severity present in the basal inferior and mid inferior location.  Findings consistent with ischemia.  This is an intermediate risk study.  Small area of mild ischemia involving mid and basal portion of the inferior wall.  Normal EF.        Recent Labs: No results found for requested labs within last 8760 hours.  Recent Lipid Panel No results found for: CHOL, TRIG, HDL, CHOLHDL, VLDL, LDLCALC, LDLDIRECT  Physical Exam:    VS:  BP 128/78 (BP Location: Right Arm, Patient Position: Sitting, Cuff Size: Normal)   Pulse 82   Ht 5\' 9"  (1.753 m)   Wt 192 lb (87.1 kg)   SpO2 97%   BMI 28.35 kg/m     Wt Readings from Last 3 Encounters:  04/09/19 192 lb (87.1 kg)  04/03/19 191 lb (86.6 kg)  03/29/19 191 lb (86.6 kg)     GEN: Patient is in no acute distress HEENT: Normal NECK: No JVD; No carotid  bruits LYMPHATICS: No lymphadenopathy CARDIAC: Hear sounds regular, 2/6 systolic murmur at the apex. RESPIRATORY:  Clear to auscultation without rales, wheezing or rhonchi  ABDOMEN: Soft, non-tender, non-distended MUSCULOSKELETAL:  No edema; No deformity  SKIN: Warm and dry NEUROLOGIC:  Alert and oriented x 3 PSYCHIATRIC:  Normal affect   Signed, 14/11/20, MD  04/09/2019 2:28 PM    [facility]

## 2019-04-10 ENCOUNTER — Telehealth: Payer: Self-pay | Admitting: Cardiology

## 2019-04-10 ENCOUNTER — Encounter: Payer: Self-pay | Admitting: *Deleted

## 2019-04-10 LAB — BASIC METABOLIC PANEL
BUN/Creatinine Ratio: 21 (ref 10–24)
BUN: 18 mg/dL (ref 8–27)
CO2: 27 mmol/L (ref 20–29)
Calcium: 8.9 mg/dL (ref 8.6–10.2)
Chloride: 102 mmol/L (ref 96–106)
Creatinine, Ser: 0.85 mg/dL (ref 0.76–1.27)
GFR calc Af Amer: 109 mL/min/{1.73_m2} (ref >59)
GFR calc non Af Amer: 95 mL/min/{1.73_m2} (ref >59)
Glucose: 103 mg/dL — ABNORMAL HIGH (ref 65–99)
Potassium: 4.4 mmol/L (ref 3.5–5.2)
Sodium: 139 mmol/L (ref 134–144)

## 2019-04-10 LAB — CBC
Hematocrit: 40.9 % (ref 37.5–51.0)
Hemoglobin: 14 g/dL (ref 13.0–17.7)
MCH: 31.2 pg (ref 26.6–33.0)
MCHC: 34.2 g/dL (ref 31.5–35.7)
MCV: 91 fL (ref 79–97)
Platelets: 262 10*3/uL (ref 150–450)
RBC: 4.49 x10E6/uL (ref 4.14–5.80)
RDW: 12.6 % (ref 11.6–15.4)
WBC: 8.9 10*3/uL (ref 3.4–10.8)

## 2019-04-10 NOTE — Telephone Encounter (Signed)
Work note generated and emailed to [employer]

## 2019-04-10 NOTE — Telephone Encounter (Signed)
New Message    Pt is calling and would like a work note to stay out of work next week Pt has a COVID test Monday 12/28 and has a procedure 12/31. Pt says he has to stay home and quarantine after COVID test  He would like for his work note to be mailed to his home address  Please advise

## 2019-04-10 NOTE — Telephone Encounter (Signed)
Please call patient regarding a work note

## 2019-04-14 ENCOUNTER — Telehealth: Payer: Self-pay | Admitting: Nurse Practitioner

## 2019-04-14 NOTE — Telephone Encounter (Signed)
   Patient called this afternoon as he is scheduled for COVID-19 testing at [facility] on December 28 in preparation for December 31 catheterization and he would instead prefer to have this done at his primary care providers in [place].  I advised that as long as this is a PCR test and he can have it done tomorrow, that should be fine, with the caveat that the results will need to be sent to our office so that it can be scanned into his epic chart and clearly visible for everyone that might need to see it on the day of his catheterization.  I also advised that it would be a bad idea for him to have a copy of the result to bring with him on the day of catheterization.  Finally, I reiterated that he will need to isolate following COVID-19 testing.  Caller verbalized understanding and was grateful for the call back.  Murray Hodgkins, NP 04/14/2019, 2:48 PM

## 2019-04-15 ENCOUNTER — Other Ambulatory Visit (HOSPITAL_COMMUNITY)
Admission: RE | Admit: 2019-04-15 | Discharge: 2019-04-15 | Disposition: A | Discharge status: Home / Self Care | Payer: BC Managed Care – PPO | Source: Ambulatory Visit | Attending: Cardiovascular Disease | Admitting: Cardiovascular Disease

## 2019-04-15 DIAGNOSIS — Z20828 Contact with and (suspected) exposure to other viral communicable diseases: Secondary | ICD-10-CM | POA: Insufficient documentation

## 2019-04-15 DIAGNOSIS — Z01812 Encounter for preprocedural laboratory examination: Secondary | ICD-10-CM | POA: Diagnosis not present

## 2019-04-16 LAB — NOVEL CORONAVIRUS, NAA (HOSP ORDER, SEND-OUT TO REF LAB; TAT 18-24 HRS): SARS-CoV-2, NAA: NOT DETECTED

## 2019-04-17 ENCOUNTER — Telehealth: Payer: Self-pay

## 2019-04-17 NOTE — Telephone Encounter (Signed)
Pt with some additional questions regarding transportation.  All questions answered.

## 2019-04-17 NOTE — Telephone Encounter (Signed)
The following pre-cath instructions were reviewed with the patient. The patient verbalized understanding and had no questions at this time.  Pt contacted pre-catheterization scheduled at [facility] for: 10:30 am  Verified arrival time and place: [facility]) at:8:30 am  No solid food after midnight prior to cath, clear liquids until 5 AM day of procedure.  Contrast allergy: No  Verified no diabetes medications: None  AM meds can be  taken pre-cath with sip of water including: ASA 81 mg  Confirmed patient has responsible adult to drive home post procedure and observe 24 hours after arriving home: Patient has aresponsible party to drive him home after the procedure and to observe him for 24 hours after the procedure.  Currently, due to Covid-19 pandemic, only one support person will be allowed with patient. Must be the same support person for that patient's entire stay, will be screened and required to wear a mask. They will be asked to wait in the waiting room for the duration of the patient's stay.  Patients are required to wear a mask when they enter the hospital.      COVID-19 Pre-Screening Questions:  . In the past 7 to 10 days have you had a cough,  shortness of breath, headache, congestion, fever (100 or greater) body aches, chills, sore throat, or sudden loss of taste or sense of smell? No . Have you been around anyone with known Covid 19? No . Have you been around anyone who is awaiting Covid 19 test results in the past 7 to 10 days? No . Have you been around anyone who has been exposed to Covid 19, or has mentioned symptoms of Covid 19 within the past 7 to 10 days? No  If you have any concerns/questions about symptoms patients report during screening (either on the phone or at threshold). Contact the provider seeing the patient or DOD for further guidance.  If neither are available contact a member of the leadership  team.

## 2019-04-18 ENCOUNTER — Other Ambulatory Visit: Payer: Self-pay

## 2019-04-18 ENCOUNTER — Encounter (HOSPITAL_COMMUNITY)
Admission: RE | Disposition: A | Discharge status: Home / Self Care | Payer: BC Managed Care – PPO | Source: Home / Self Care | Attending: Cardiovascular Disease

## 2019-04-18 ENCOUNTER — Ambulatory Visit (HOSPITAL_COMMUNITY)
Admission: RE | Admit: 2019-04-18 | Discharge: 2019-04-18 | Disposition: A | Discharge status: Home / Self Care | Payer: BC Managed Care – PPO | Attending: Cardiovascular Disease | Admitting: Cardiovascular Disease

## 2019-04-18 DIAGNOSIS — Z885 Allergy status to narcotic agent status: Secondary | ICD-10-CM | POA: Diagnosis not present

## 2019-04-18 DIAGNOSIS — R931 Abnormal findings on diagnostic imaging of heart and coronary circulation: Secondary | ICD-10-CM | POA: Insufficient documentation

## 2019-04-18 DIAGNOSIS — I10 Essential (primary) hypertension: Secondary | ICD-10-CM | POA: Insufficient documentation

## 2019-04-18 DIAGNOSIS — R9439 Abnormal result of other cardiovascular function study: Secondary | ICD-10-CM | POA: Diagnosis not present

## 2019-04-18 DIAGNOSIS — R0789 Other chest pain: Secondary | ICD-10-CM

## 2019-04-18 DIAGNOSIS — Z79899 Other long term (current) drug therapy: Secondary | ICD-10-CM | POA: Diagnosis not present

## 2019-04-18 HISTORY — PX: LEFT HEART CATH AND CORONARY ANGIOGRAPHY: CATH118249

## 2019-04-18 SURGERY — LEFT HEART CATH AND CORONARY ANGIOGRAPHY
Anesthesia: LOCAL

## 2019-04-18 MED ORDER — SODIUM CHLORIDE 0.9 % WEIGHT BASED INFUSION
3.0000 mL/kg/h | INTRAVENOUS | Status: AC
  Administered 2019-04-18: 3 mL/kg/h via INTRAVENOUS

## 2019-04-18 MED ORDER — SODIUM CHLORIDE 0.9% FLUSH: 3.0000 mL | Freq: Two times a day (BID) | INTRAVENOUS | Status: DC

## 2019-04-18 MED ORDER — MIDAZOLAM HCL 2 MG/2ML IJ SOLN
INTRAMUSCULAR | Status: AC
  Filled 2019-04-18: qty 2

## 2019-04-18 MED ORDER — SODIUM CHLORIDE 0.9 % WEIGHT BASED INFUSION: 1.0000 mL/kg/h | INTRAVENOUS | Status: DC

## 2019-04-18 MED ORDER — HEPARIN SODIUM (PORCINE) 1000 UNIT/ML IJ SOLN
INTRAMUSCULAR | Status: AC
  Filled 2019-04-18: qty 1

## 2019-04-18 MED ORDER — HYDRALAZINE HCL 20 MG/ML IJ SOLN: 10.0000 mg | INTRAMUSCULAR | Status: DC | PRN

## 2019-04-18 MED ORDER — SODIUM CHLORIDE 0.9 % IV SOLN: 250.0000 mL | INTRAVENOUS | Status: DC | PRN

## 2019-04-18 MED ORDER — SODIUM CHLORIDE 0.9% FLUSH: 3.0000 mL | INTRAVENOUS | Status: DC | PRN

## 2019-04-18 MED ORDER — ACETAMINOPHEN 325 MG PO TABS: 650.0000 mg | ORAL_TABLET | ORAL | Status: DC | PRN

## 2019-04-18 MED ORDER — VERAPAMIL HCL 2.5 MG/ML IV SOLN
INTRAVENOUS | Status: AC
  Filled 2019-04-18: qty 2

## 2019-04-18 MED ORDER — HEPARIN (PORCINE) IN NACL 1000-0.9 UT/500ML-% IV SOLN
INTRAVENOUS | Status: AC
  Filled 2019-04-18: qty 1000

## 2019-04-18 MED ORDER — IOHEXOL 350 MG/ML SOLN
INTRAVENOUS | Status: DC | PRN
  Administered 2019-04-18: 55 mL

## 2019-04-18 MED ORDER — FENTANYL CITRATE (PF) 100 MCG/2ML IJ SOLN
INTRAMUSCULAR | Status: AC
  Filled 2019-04-18: qty 2

## 2019-04-18 MED ORDER — VERAPAMIL HCL 2.5 MG/ML IV SOLN
INTRAVENOUS | Status: DC | PRN
  Administered 2019-04-18: 10 mL via INTRA_ARTERIAL

## 2019-04-18 MED ORDER — FENTANYL CITRATE (PF) 100 MCG/2ML IJ SOLN
INTRAMUSCULAR | Status: DC | PRN
  Administered 2019-04-18: 25 ug via INTRAVENOUS

## 2019-04-18 MED ORDER — ONDANSETRON HCL 4 MG/2ML IJ SOLN: 4.0000 mg | Freq: Four times a day (QID) | INTRAMUSCULAR | Status: DC | PRN

## 2019-04-18 MED ORDER — MIDAZOLAM HCL 2 MG/2ML IJ SOLN
INTRAMUSCULAR | Status: DC | PRN
  Administered 2019-04-18: 2 mg via INTRAVENOUS

## 2019-04-18 MED ORDER — HEPARIN (PORCINE) IN NACL 1000-0.9 UT/500ML-% IV SOLN
INTRAVENOUS | Status: DC | PRN
  Administered 2019-04-18 (×2): 500 mL

## 2019-04-18 MED ORDER — LABETALOL HCL 5 MG/ML IV SOLN: 10.0000 mg | INTRAVENOUS | Status: DC | PRN

## 2019-04-18 MED ORDER — LIDOCAINE HCL (PF) 1 % IJ SOLN
INTRAMUSCULAR | Status: DC | PRN
  Administered 2019-04-18: 2 mL

## 2019-04-18 MED ORDER — HEPARIN SODIUM (PORCINE) 1000 UNIT/ML IJ SOLN
INTRAMUSCULAR | Status: DC | PRN
  Administered 2019-04-18: 5000 [IU] via INTRAVENOUS

## 2019-04-18 MED ORDER — ASPIRIN 81 MG PO CHEW: 81.0000 mg | CHEWABLE_TABLET | ORAL | Status: DC

## 2019-04-18 MED ORDER — LIDOCAINE HCL (PF) 1 % IJ SOLN
INTRAMUSCULAR | Status: AC
  Filled 2019-04-18: qty 30

## 2019-04-18 SURGICAL SUPPLY — 9 items
CATH 5FR JL3.5 JR4 ANG PIG MP (CATHETERS) ×2 IMPLANT
DEVICE RAD COMP TR BAND LRG (VASCULAR PRODUCTS) ×2 IMPLANT
GLIDESHEATH SLEND SS 6F .021 (SHEATH) ×2 IMPLANT
GUIDEWIRE INQWIRE 1.5J.035X260 (WIRE) ×1 IMPLANT
INQWIRE 1.5J .035X260CM (WIRE) ×2
KIT HEART LEFT (KITS) ×2 IMPLANT
PACK CARDIAC CATHETERIZATION (CUSTOM PROCEDURE TRAY) ×2 IMPLANT
TRANSDUCER W/STOPCOCK (MISCELLANEOUS) ×2 IMPLANT
TUBING CIL FLEX 10 FLL-RA (TUBING) ×2 IMPLANT

## 2019-04-18 NOTE — Interval H&P Note (Signed)
Cath Lab Visit (complete for each Cath Lab visit)  Clinical Evaluation Leading to the Procedure:   ACS: No.  Non-ACS:    Anginal Classification: CCS III  Anti-ischemic medical therapy: No Therapy  Non-Invasive Test Results: Intermediate-risk stress test findings: cardiac mortality 1-3%/year  Prior CABG: No previous CABG      History and Physical Interval Note:  04/18/2019 10:11 AM  Brandon Love  has presented today for surgery, with the diagnosis of Abnormal Stress Test.  The various methods of treatment have been discussed with the patient and family. After consideration of risks, benefits and other options for treatment, the patient has consented to  Procedure(s): LEFT HEART CATH AND CORONARY ANGIOGRAPHY (N/A) as a surgical intervention.  The patient's history has been reviewed, patient examined, no change in status, stable for surgery.  I have reviewed the patient's chart and labs.  Questions were answered to the patient's satisfaction.     Sherren Mocha

## 2019-04-18 NOTE — Discharge Instructions (Signed)
Radial Site Care  This sheet gives you information about how to care for yourself after your procedure. Your health care provider may also give you more specific instructions. If you have problems or questions, contact your health care provider. What can I expect after the procedure? After the procedure, it is common to have:  Bruising and tenderness at the catheter insertion area. Follow these instructions at home: Medicines  Take over-the-counter and prescription medicines only as told by your health care provider. Insertion site care  Follow instructions from your health care provider about how to take care of your insertion site. Make sure you: ? Wash your hands with soap and water before you change your bandage (dressing). If soap and water are not available, use hand sanitizer. ? Change your dressing as told by your health care provider.  Check your insertion site every day for signs of infection. Check for: ? Redness, swelling, or pain. ? Fluid or blood. ? Pus or a bad smell. ? Warmth.  Do not take baths, swim, or use a hot tub for 5 days.  You may shower 24-48 hours after the procedure. ? Remove the dressing and gently wash the site with plain soap and water. ? Pat the area dry with a clean towel. ? Do not rub the site. That could cause bleeding.  Do not apply powder or lotion to the site. Activity   For 24 hours after the procedure, or as directed by your health care provider: ? Do not flex or bend the affected arm. ? Do not push or pull heavy objects with the affected arm. ? Do not drive yourself home from the hospital or clinic. You may drive 24 hours after the procedure unless your health care provider tells you not to. ? Do not operate machinery or power tools.  Do not lift anything that is heavier than 10 lb for 5 days  Ask your health care provider when it is okay to: ? Return to work or school. ? Resume usual physical activities or sports. ? Resume sexual  activity. General instructions  If the catheter site starts to bleed, raise your arm and put firm pressure on the site. If the bleeding does not stop, get help right away. This is a medical emergency.  If you went home on the same day as your procedure, a responsible adult should be with you for the first 24 hours after you arrive home.  Keep all follow-up visits as told by your health care provider. This is important. Contact a health care provider if:  You have a fever.  You have redness, swelling, or yellow drainage around your insertion site. Get help right away if:  You have unusual pain at the radial site.  The catheter insertion area swells very fast.  The insertion area is bleeding, and the bleeding does not stop when you hold steady pressure on the area.  Your arm or hand becomes pale, cool, tingly, or numb. These symptoms may represent a serious problem that is an emergency. Do not wait to see if the symptoms will go away. Get medical help right away. Call your local emergency services (911 in the [place] Do not drive yourself to the hospital. Summary  After the procedure, it is common to have bruising and tenderness at the site.  Follow instructions from your health care provider about how to take care of your radial site wound. Check the wound every day for signs of infection.  Do not lift anything  that is heavier than 10 lb (for 5 days.  This information is not intended to replace advice given to you by your health care provider. Make sure you discuss any questions you have with your health care provider. Document Revised: 05/10/2017 Document Reviewed: 05/10/2017 Elsevier Patient Education  2020 [employer].

## 2019-04-22 ENCOUNTER — Telehealth: Payer: Self-pay | Admitting: Cardiology

## 2019-04-22 NOTE — Telephone Encounter (Signed)
Patient needs a note stating that he is ok to start back to work without restrictions and that is beginning 04/24/2019... please fax to [phone].  Please address as quickly as possible so he may start back.

## 2019-04-23 ENCOUNTER — Telehealth: Payer: Self-pay | Admitting: Cardiology

## 2019-04-23 NOTE — Telephone Encounter (Signed)
Faxed to requested party.

## 2019-04-23 NOTE — Telephone Encounter (Signed)
Okay to fax from a cardiac standpoint.

## 2019-04-23 NOTE — Telephone Encounter (Signed)
Patient would like a note faxed to his work to attention [employer] stating patient is able to return to work on 04-24-2019 without any restrictions. Her fax number is [phone].

## 2019-04-23 NOTE — Telephone Encounter (Signed)
[name], can you take care of this for Brandon Love? He has called three times to get this done so he can go back to work tomorrow?

## 2019-04-24 ENCOUNTER — Ambulatory Visit: Payer: BC Managed Care – PPO | Admitting: Cardiology

## 2019-05-29 ENCOUNTER — Ambulatory Visit: Payer: BC Managed Care – PPO | Admitting: Cardiology

## 2019-06-03 ENCOUNTER — Ambulatory Visit: Payer: BC Managed Care – PPO | Admitting: Cardiology

## 2019-10-17 DIAGNOSIS — Z6828 Body mass index (BMI) 28.0-28.9, adult: Secondary | ICD-10-CM | POA: Diagnosis not present

## 2019-10-17 DIAGNOSIS — I10 Essential (primary) hypertension: Secondary | ICD-10-CM | POA: Diagnosis not present

## 2019-10-17 DIAGNOSIS — F4521 Hypochondriasis: Secondary | ICD-10-CM | POA: Diagnosis not present

## 2019-10-17 DIAGNOSIS — E7800 Pure hypercholesterolemia, unspecified: Secondary | ICD-10-CM | POA: Diagnosis not present

## 2019-12-12 DIAGNOSIS — R05 Cough: Secondary | ICD-10-CM | POA: Diagnosis not present

## 2019-12-12 DIAGNOSIS — Z20828 Contact with and (suspected) exposure to other viral communicable diseases: Secondary | ICD-10-CM | POA: Diagnosis not present

## 2019-12-12 DIAGNOSIS — H9202 Otalgia, left ear: Secondary | ICD-10-CM | POA: Diagnosis not present

## 2019-12-12 DIAGNOSIS — J0100 Acute maxillary sinusitis, unspecified: Secondary | ICD-10-CM | POA: Diagnosis not present

## 2019-12-12 DIAGNOSIS — H9201 Otalgia, right ear: Secondary | ICD-10-CM | POA: Diagnosis not present

## 2019-12-17 DIAGNOSIS — J40 Bronchitis, not specified as acute or chronic: Secondary | ICD-10-CM | POA: Diagnosis not present

## 2019-12-17 DIAGNOSIS — F419 Anxiety disorder, unspecified: Secondary | ICD-10-CM | POA: Diagnosis not present

## 2019-12-17 DIAGNOSIS — J329 Chronic sinusitis, unspecified: Secondary | ICD-10-CM | POA: Diagnosis not present

## 2020-02-27 DIAGNOSIS — I10 Essential (primary) hypertension: Secondary | ICD-10-CM | POA: Diagnosis not present

## 2020-02-27 DIAGNOSIS — Z23 Encounter for immunization: Secondary | ICD-10-CM | POA: Diagnosis not present

## 2020-02-27 DIAGNOSIS — Z6828 Body mass index (BMI) 28.0-28.9, adult: Secondary | ICD-10-CM | POA: Diagnosis not present

## 2020-03-03 DIAGNOSIS — M4003 Postural kyphosis, cervicothoracic region: Secondary | ICD-10-CM | POA: Diagnosis not present

## 2020-03-03 DIAGNOSIS — M4046 Postural lordosis, lumbar region: Secondary | ICD-10-CM | POA: Diagnosis not present

## 2020-03-03 DIAGNOSIS — M545 Low back pain, unspecified: Secondary | ICD-10-CM | POA: Diagnosis not present

## 2020-03-06 DIAGNOSIS — M4003 Postural kyphosis, cervicothoracic region: Secondary | ICD-10-CM | POA: Diagnosis not present

## 2020-03-06 DIAGNOSIS — M4046 Postural lordosis, lumbar region: Secondary | ICD-10-CM | POA: Diagnosis not present

## 2020-03-06 DIAGNOSIS — M545 Low back pain, unspecified: Secondary | ICD-10-CM | POA: Diagnosis not present

## 2020-03-09 DIAGNOSIS — M4003 Postural kyphosis, cervicothoracic region: Secondary | ICD-10-CM | POA: Diagnosis not present

## 2020-03-09 DIAGNOSIS — M545 Low back pain, unspecified: Secondary | ICD-10-CM | POA: Diagnosis not present

## 2020-03-09 DIAGNOSIS — M4046 Postural lordosis, lumbar region: Secondary | ICD-10-CM | POA: Diagnosis not present

## 2020-03-17 DIAGNOSIS — M4046 Postural lordosis, lumbar region: Secondary | ICD-10-CM | POA: Diagnosis not present

## 2020-03-17 DIAGNOSIS — M545 Low back pain, unspecified: Secondary | ICD-10-CM | POA: Diagnosis not present

## 2020-03-17 DIAGNOSIS — M4003 Postural kyphosis, cervicothoracic region: Secondary | ICD-10-CM | POA: Diagnosis not present

## 2020-03-24 DIAGNOSIS — M4046 Postural lordosis, lumbar region: Secondary | ICD-10-CM | POA: Diagnosis not present

## 2020-03-24 DIAGNOSIS — M545 Low back pain, unspecified: Secondary | ICD-10-CM | POA: Diagnosis not present

## 2020-03-24 DIAGNOSIS — M4003 Postural kyphosis, cervicothoracic region: Secondary | ICD-10-CM | POA: Diagnosis not present

## 2020-07-14 DIAGNOSIS — M549 Dorsalgia, unspecified: Secondary | ICD-10-CM | POA: Diagnosis not present

## 2020-07-14 DIAGNOSIS — I10 Essential (primary) hypertension: Secondary | ICD-10-CM | POA: Diagnosis not present

## 2020-07-14 DIAGNOSIS — Z6828 Body mass index (BMI) 28.0-28.9, adult: Secondary | ICD-10-CM | POA: Diagnosis not present

## 2020-07-14 DIAGNOSIS — Z125 Encounter for screening for malignant neoplasm of prostate: Secondary | ICD-10-CM | POA: Diagnosis not present

## 2020-07-14 DIAGNOSIS — E7800 Pure hypercholesterolemia, unspecified: Secondary | ICD-10-CM | POA: Diagnosis not present

## 2020-08-13 DIAGNOSIS — M47816 Spondylosis without myelopathy or radiculopathy, lumbar region: Secondary | ICD-10-CM | POA: Diagnosis not present

## 2020-08-13 DIAGNOSIS — M545 Low back pain, unspecified: Secondary | ICD-10-CM | POA: Diagnosis not present

## 2020-08-13 DIAGNOSIS — M4316 Spondylolisthesis, lumbar region: Secondary | ICD-10-CM | POA: Diagnosis not present

## 2020-08-13 DIAGNOSIS — M419 Scoliosis, unspecified: Secondary | ICD-10-CM | POA: Diagnosis not present

## 2020-08-27 DIAGNOSIS — M5136 Other intervertebral disc degeneration, lumbar region: Secondary | ICD-10-CM | POA: Diagnosis not present

## 2020-08-27 DIAGNOSIS — M5127 Other intervertebral disc displacement, lumbosacral region: Secondary | ICD-10-CM | POA: Diagnosis not present

## 2020-08-27 DIAGNOSIS — M47816 Spondylosis without myelopathy or radiculopathy, lumbar region: Secondary | ICD-10-CM | POA: Diagnosis not present

## 2020-08-27 DIAGNOSIS — M5126 Other intervertebral disc displacement, lumbar region: Secondary | ICD-10-CM | POA: Diagnosis not present

## 2020-08-27 DIAGNOSIS — M48061 Spinal stenosis, lumbar region without neurogenic claudication: Secondary | ICD-10-CM | POA: Diagnosis not present

## 2020-09-10 DIAGNOSIS — M4316 Spondylolisthesis, lumbar region: Secondary | ICD-10-CM | POA: Diagnosis not present

## 2020-09-10 DIAGNOSIS — M419 Scoliosis, unspecified: Secondary | ICD-10-CM | POA: Diagnosis not present

## 2020-09-10 DIAGNOSIS — M47816 Spondylosis without myelopathy or radiculopathy, lumbar region: Secondary | ICD-10-CM | POA: Diagnosis not present

## 2020-09-22 DIAGNOSIS — Z6829 Body mass index (BMI) 29.0-29.9, adult: Secondary | ICD-10-CM | POA: Diagnosis not present

## 2020-09-22 DIAGNOSIS — Z1331 Encounter for screening for depression: Secondary | ICD-10-CM | POA: Diagnosis not present

## 2020-09-22 DIAGNOSIS — I10 Essential (primary) hypertension: Secondary | ICD-10-CM | POA: Diagnosis not present

## 2020-09-22 DIAGNOSIS — E7800 Pure hypercholesterolemia, unspecified: Secondary | ICD-10-CM | POA: Diagnosis not present

## 2020-09-25 DIAGNOSIS — M9905 Segmental and somatic dysfunction of pelvic region: Secondary | ICD-10-CM | POA: Diagnosis not present

## 2020-09-25 DIAGNOSIS — M9902 Segmental and somatic dysfunction of thoracic region: Secondary | ICD-10-CM | POA: Diagnosis not present

## 2020-09-25 DIAGNOSIS — M9903 Segmental and somatic dysfunction of lumbar region: Secondary | ICD-10-CM | POA: Diagnosis not present

## 2020-09-25 DIAGNOSIS — M5451 Vertebrogenic low back pain: Secondary | ICD-10-CM | POA: Diagnosis not present

## 2020-10-23 DIAGNOSIS — N451 Epididymitis: Secondary | ICD-10-CM | POA: Diagnosis not present

## 2020-10-23 DIAGNOSIS — Z6829 Body mass index (BMI) 29.0-29.9, adult: Secondary | ICD-10-CM | POA: Diagnosis not present

## 2020-10-23 DIAGNOSIS — I10 Essential (primary) hypertension: Secondary | ICD-10-CM | POA: Diagnosis not present

## 2020-10-23 DIAGNOSIS — K921 Melena: Secondary | ICD-10-CM | POA: Diagnosis not present

## 2020-11-10 DIAGNOSIS — R109 Unspecified abdominal pain: Secondary | ICD-10-CM | POA: Diagnosis not present

## 2020-11-10 DIAGNOSIS — K5900 Constipation, unspecified: Secondary | ICD-10-CM | POA: Diagnosis not present

## 2020-11-10 DIAGNOSIS — Z6829 Body mass index (BMI) 29.0-29.9, adult: Secondary | ICD-10-CM | POA: Diagnosis not present

## 2020-11-19 IMAGING — DX DG CHEST 2V
2 series · 2 of 2 positions shown · non-contrast
Comparison: 07/24/2014.

CLINICAL DATA: Chest discomfort.

EXAM:
CHEST - 2 VIEW

[chest pa]
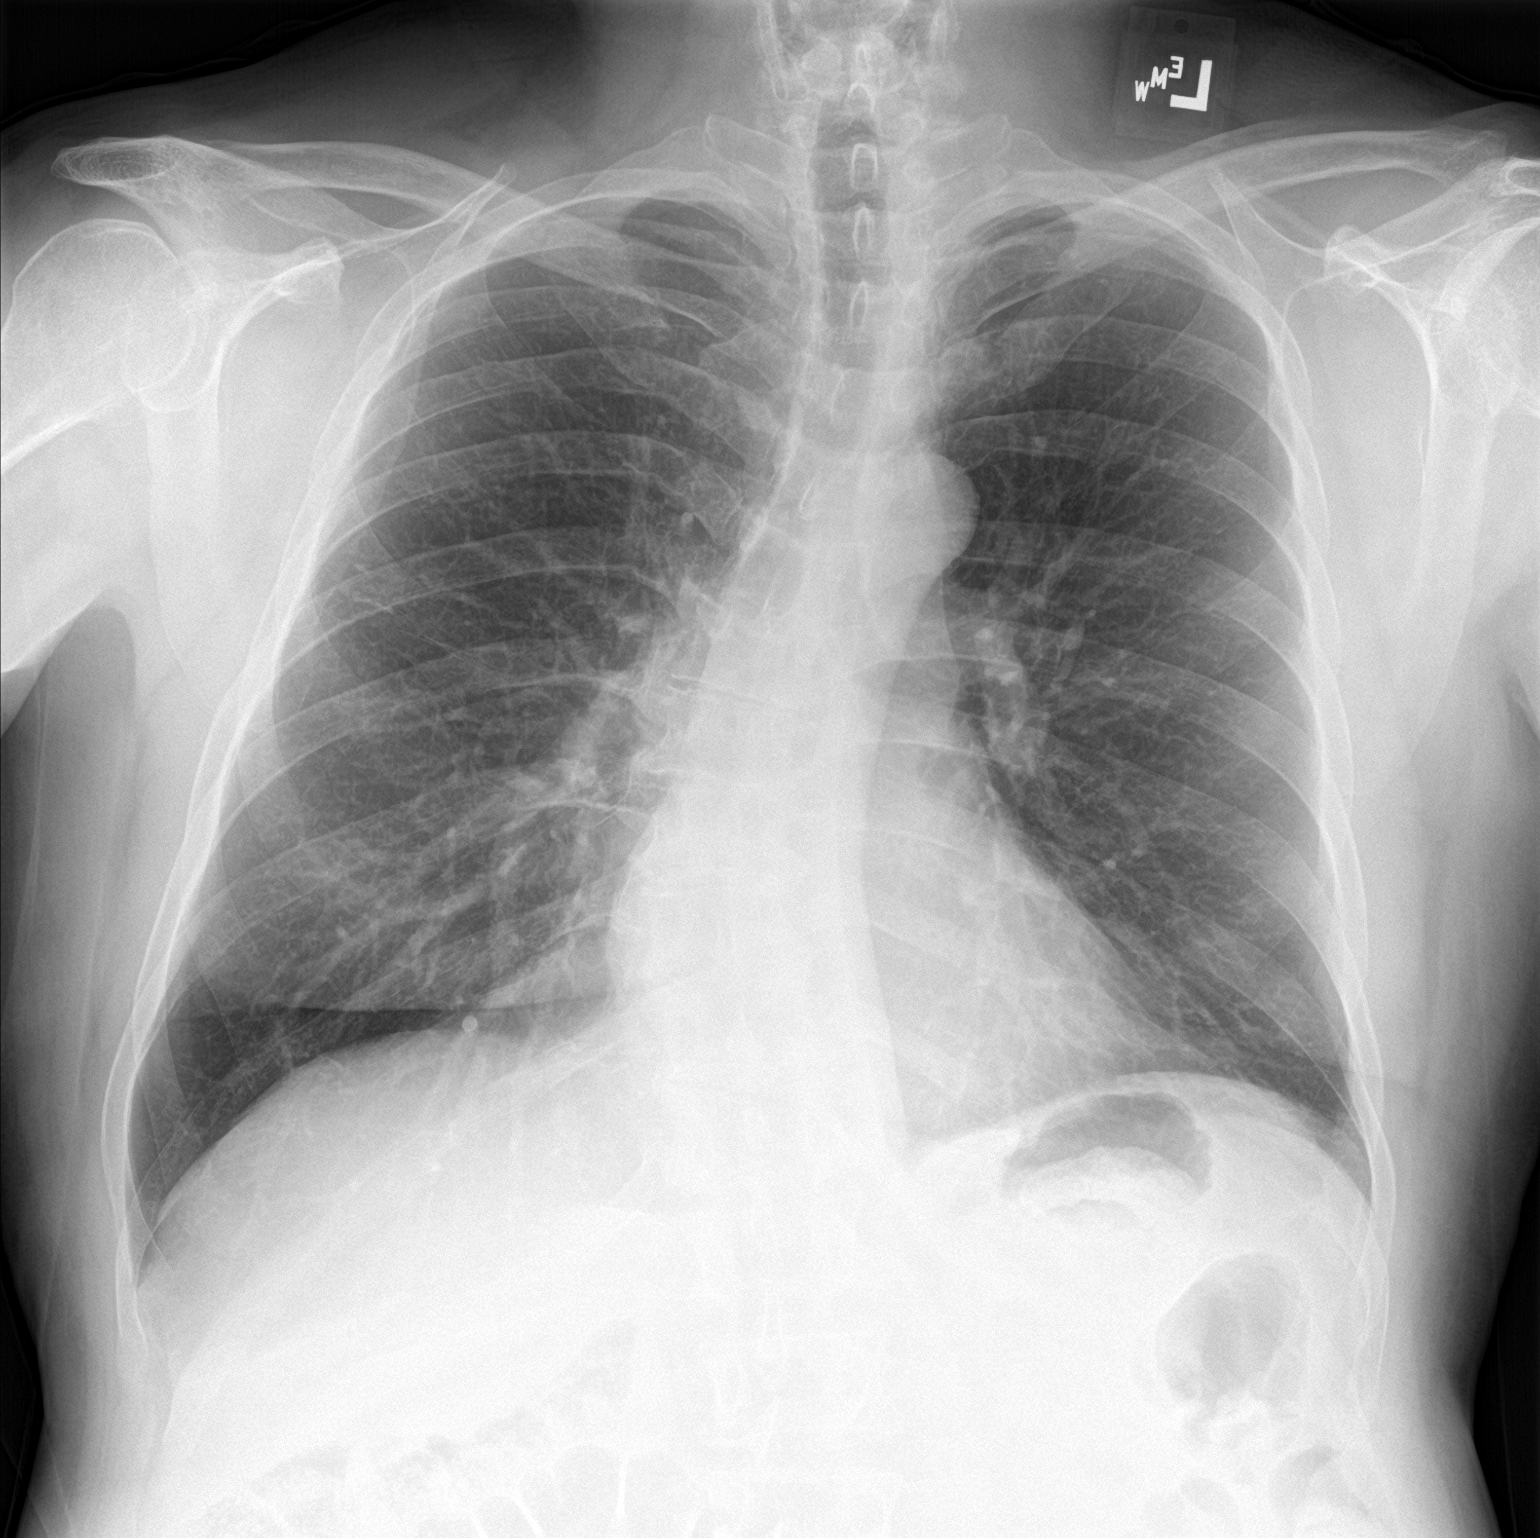

[chest lat]
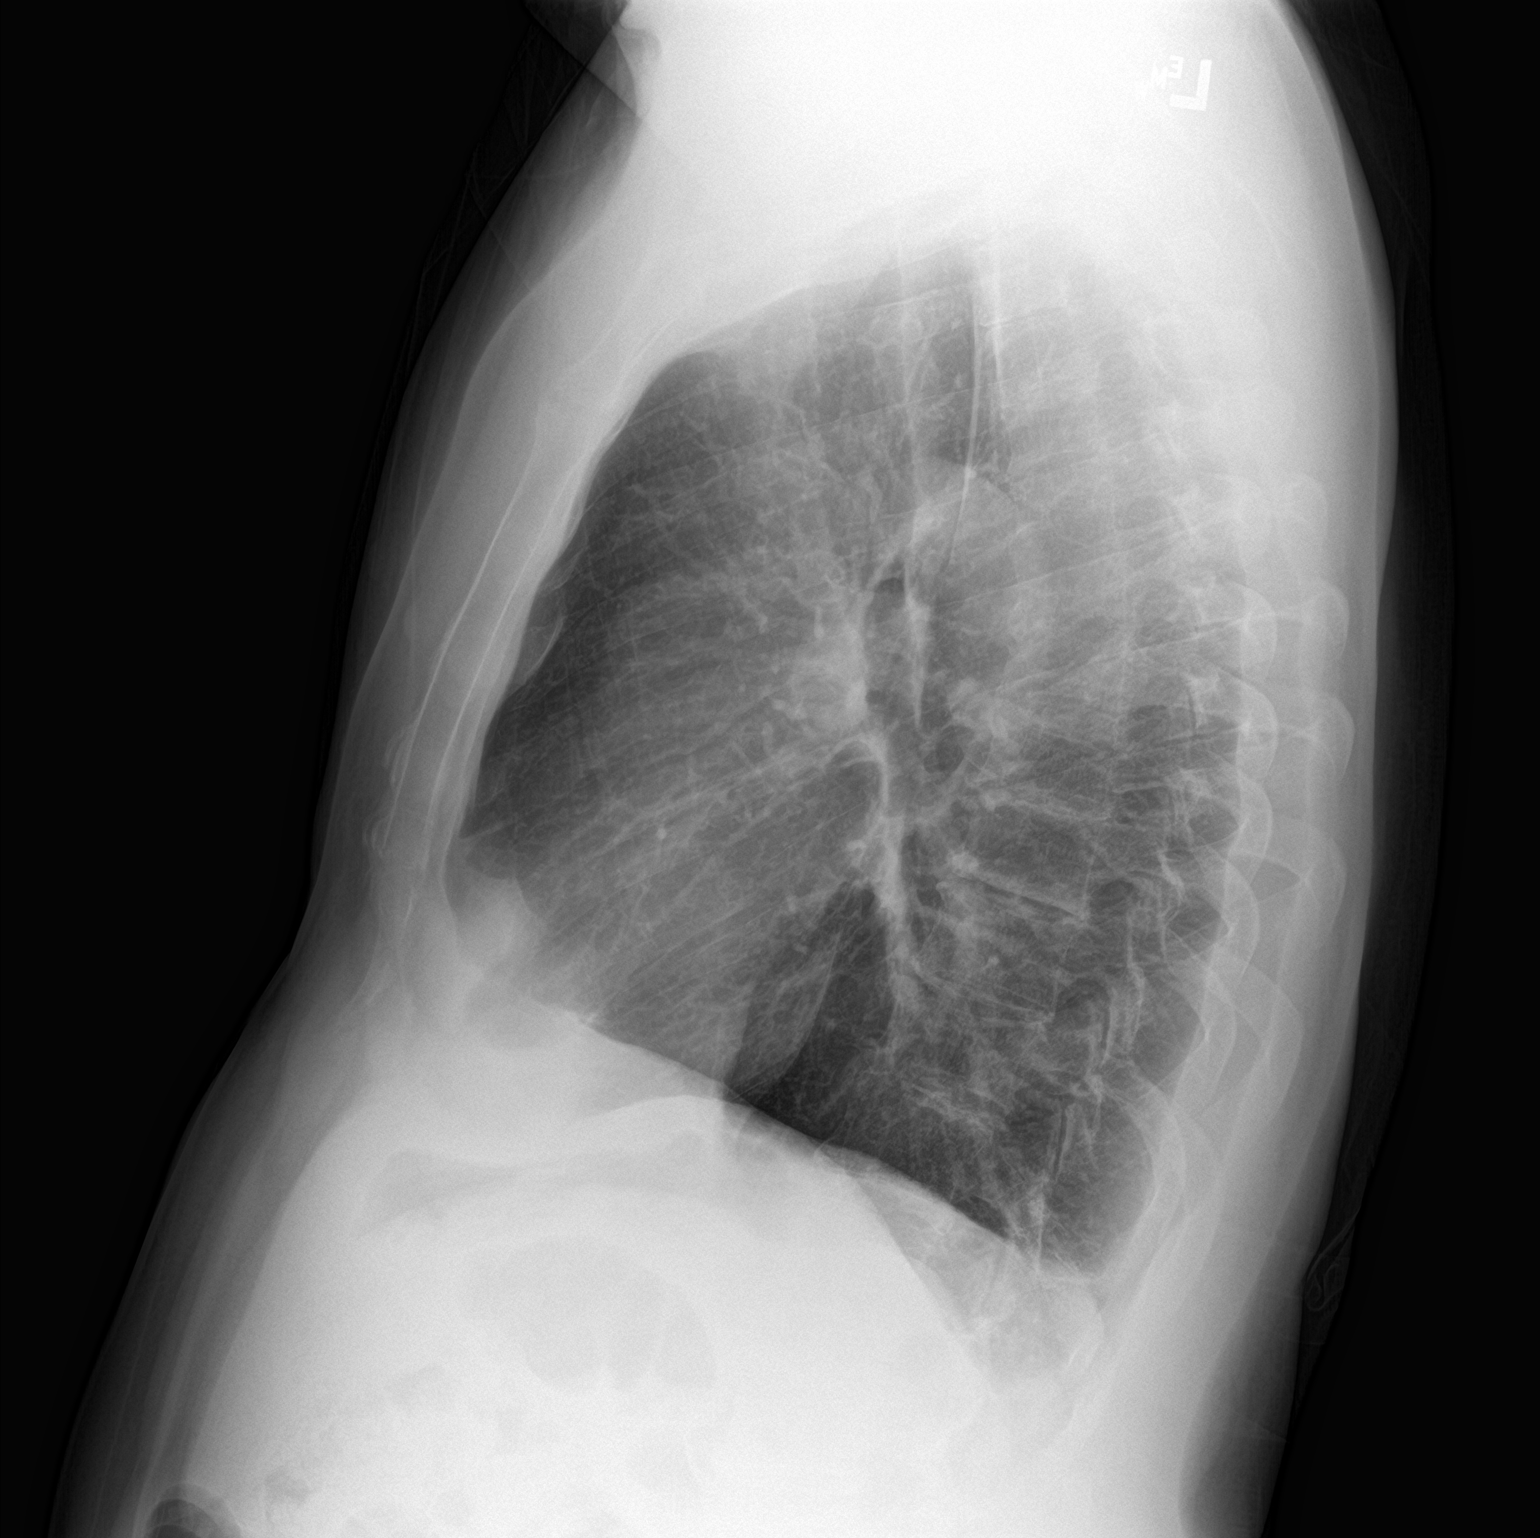

[2 of 2 positions shown; findings below may reference images not displayed]

FINDINGS: Mediastinum and hilar structures normal. Cardiac fat pad again
noted. Heart size normal. No focal infiltrate. No pleural effusion
or pneumothorax. Degenerative change and scoliosis thoracic spine.
IMPRESSION: No acute cardiopulmonary disease.  Exam stable from prior exam.

## 2020-11-20 DIAGNOSIS — K5900 Constipation, unspecified: Secondary | ICD-10-CM | POA: Diagnosis not present

## 2020-11-20 DIAGNOSIS — R109 Unspecified abdominal pain: Secondary | ICD-10-CM | POA: Diagnosis not present

## 2020-11-20 DIAGNOSIS — K5730 Diverticulosis of large intestine without perforation or abscess without bleeding: Secondary | ICD-10-CM | POA: Diagnosis not present

## 2020-11-20 DIAGNOSIS — I700 Atherosclerosis of aorta: Secondary | ICD-10-CM | POA: Diagnosis not present

## 2020-11-20 DIAGNOSIS — R1030 Lower abdominal pain, unspecified: Secondary | ICD-10-CM | POA: Diagnosis not present

## 2020-12-22 DIAGNOSIS — Z6828 Body mass index (BMI) 28.0-28.9, adult: Secondary | ICD-10-CM | POA: Diagnosis not present

## 2020-12-22 DIAGNOSIS — K5909 Other constipation: Secondary | ICD-10-CM | POA: Diagnosis not present

## 2020-12-22 DIAGNOSIS — K589 Irritable bowel syndrome without diarrhea: Secondary | ICD-10-CM | POA: Diagnosis not present

## 2020-12-22 DIAGNOSIS — I10 Essential (primary) hypertension: Secondary | ICD-10-CM | POA: Diagnosis not present

## 2020-12-23 DIAGNOSIS — N433 Hydrocele, unspecified: Secondary | ICD-10-CM | POA: Diagnosis not present

## 2020-12-23 DIAGNOSIS — N503 Cyst of epididymis: Secondary | ICD-10-CM | POA: Diagnosis not present

## 2020-12-23 DIAGNOSIS — N451 Epididymitis: Secondary | ICD-10-CM | POA: Diagnosis not present

## 2021-01-05 DIAGNOSIS — R152 Fecal urgency: Secondary | ICD-10-CM | POA: Diagnosis not present

## 2021-01-05 DIAGNOSIS — R634 Abnormal weight loss: Secondary | ICD-10-CM | POA: Diagnosis not present

## 2021-01-05 DIAGNOSIS — R159 Full incontinence of feces: Secondary | ICD-10-CM | POA: Diagnosis not present

## 2021-01-05 DIAGNOSIS — R1033 Periumbilical pain: Secondary | ICD-10-CM | POA: Insufficient documentation

## 2021-01-06 DIAGNOSIS — R152 Fecal urgency: Secondary | ICD-10-CM | POA: Diagnosis not present

## 2021-01-06 DIAGNOSIS — R634 Abnormal weight loss: Secondary | ICD-10-CM | POA: Diagnosis not present

## 2021-01-06 DIAGNOSIS — R159 Full incontinence of feces: Secondary | ICD-10-CM | POA: Diagnosis not present

## 2021-01-06 DIAGNOSIS — R1033 Periumbilical pain: Secondary | ICD-10-CM | POA: Diagnosis not present

## 2021-01-07 DIAGNOSIS — R109 Unspecified abdominal pain: Secondary | ICD-10-CM | POA: Diagnosis not present

## 2021-01-07 DIAGNOSIS — R198 Other specified symptoms and signs involving the digestive system and abdomen: Secondary | ICD-10-CM | POA: Diagnosis not present

## 2021-01-07 DIAGNOSIS — Z1212 Encounter for screening for malignant neoplasm of rectum: Secondary | ICD-10-CM | POA: Diagnosis not present

## 2021-01-08 DIAGNOSIS — R198 Other specified symptoms and signs involving the digestive system and abdomen: Secondary | ICD-10-CM | POA: Diagnosis not present

## 2021-02-03 DIAGNOSIS — Z8042 Family history of malignant neoplasm of prostate: Secondary | ICD-10-CM | POA: Diagnosis not present

## 2021-02-03 DIAGNOSIS — Z125 Encounter for screening for malignant neoplasm of prostate: Secondary | ICD-10-CM | POA: Diagnosis not present

## 2021-02-03 DIAGNOSIS — K588 Other irritable bowel syndrome: Secondary | ICD-10-CM | POA: Insufficient documentation

## 2021-02-03 DIAGNOSIS — Z2821 Immunization not carried out because of patient refusal: Secondary | ICD-10-CM | POA: Diagnosis not present

## 2021-02-03 DIAGNOSIS — R351 Nocturia: Secondary | ICD-10-CM | POA: Diagnosis not present

## 2021-02-08 DIAGNOSIS — N401 Enlarged prostate with lower urinary tract symptoms: Secondary | ICD-10-CM | POA: Diagnosis not present

## 2021-02-08 DIAGNOSIS — N503 Cyst of epididymis: Secondary | ICD-10-CM | POA: Diagnosis not present

## 2021-02-08 DIAGNOSIS — R351 Nocturia: Secondary | ICD-10-CM | POA: Diagnosis not present

## 2021-02-08 DIAGNOSIS — N433 Hydrocele, unspecified: Secondary | ICD-10-CM | POA: Diagnosis not present

## 2021-02-08 DIAGNOSIS — Z79899 Other long term (current) drug therapy: Secondary | ICD-10-CM | POA: Diagnosis not present

## 2021-03-09 DIAGNOSIS — M419 Scoliosis, unspecified: Secondary | ICD-10-CM | POA: Diagnosis not present

## 2021-03-09 DIAGNOSIS — K5900 Constipation, unspecified: Secondary | ICD-10-CM | POA: Diagnosis not present

## 2021-03-09 DIAGNOSIS — K588 Other irritable bowel syndrome: Secondary | ICD-10-CM | POA: Diagnosis not present

## 2021-03-09 DIAGNOSIS — Z9889 Other specified postprocedural states: Secondary | ICD-10-CM | POA: Diagnosis not present

## 2021-03-09 DIAGNOSIS — R1033 Periumbilical pain: Secondary | ICD-10-CM | POA: Diagnosis not present

## 2021-03-09 DIAGNOSIS — Z1211 Encounter for screening for malignant neoplasm of colon: Secondary | ICD-10-CM | POA: Diagnosis not present

## 2021-03-09 DIAGNOSIS — R159 Full incontinence of feces: Secondary | ICD-10-CM | POA: Diagnosis not present

## 2021-03-18 DIAGNOSIS — I10 Essential (primary) hypertension: Secondary | ICD-10-CM | POA: Diagnosis not present

## 2021-03-18 DIAGNOSIS — L304 Erythema intertrigo: Secondary | ICD-10-CM | POA: Insufficient documentation

## 2021-03-18 DIAGNOSIS — Z6828 Body mass index (BMI) 28.0-28.9, adult: Secondary | ICD-10-CM | POA: Diagnosis not present

## 2021-03-18 DIAGNOSIS — Z13220 Encounter for screening for lipoid disorders: Secondary | ICD-10-CM | POA: Diagnosis not present

## 2021-03-18 DIAGNOSIS — Z0001 Encounter for general adult medical examination with abnormal findings: Secondary | ICD-10-CM | POA: Diagnosis not present

## 2021-03-31 DIAGNOSIS — K5900 Constipation, unspecified: Secondary | ICD-10-CM | POA: Diagnosis not present

## 2021-03-31 DIAGNOSIS — M419 Scoliosis, unspecified: Secondary | ICD-10-CM | POA: Diagnosis not present

## 2021-03-31 DIAGNOSIS — K529 Noninfective gastroenteritis and colitis, unspecified: Secondary | ICD-10-CM | POA: Diagnosis not present

## 2021-04-08 DIAGNOSIS — R1111 Vomiting without nausea: Secondary | ICD-10-CM | POA: Diagnosis not present

## 2021-04-08 DIAGNOSIS — R1084 Generalized abdominal pain: Secondary | ICD-10-CM | POA: Diagnosis not present

## 2021-04-08 DIAGNOSIS — R110 Nausea: Secondary | ICD-10-CM | POA: Diagnosis not present

## 2021-04-08 DIAGNOSIS — R197 Diarrhea, unspecified: Secondary | ICD-10-CM | POA: Diagnosis not present

## 2021-04-09 DIAGNOSIS — Z20822 Contact with and (suspected) exposure to covid-19: Secondary | ICD-10-CM | POA: Diagnosis not present

## 2021-04-09 DIAGNOSIS — I10 Essential (primary) hypertension: Secondary | ICD-10-CM | POA: Diagnosis not present

## 2021-04-09 DIAGNOSIS — I491 Atrial premature depolarization: Secondary | ICD-10-CM | POA: Diagnosis not present

## 2021-04-09 DIAGNOSIS — Z79899 Other long term (current) drug therapy: Secondary | ICD-10-CM | POA: Diagnosis not present

## 2021-04-09 DIAGNOSIS — R1110 Vomiting, unspecified: Secondary | ICD-10-CM | POA: Diagnosis not present

## 2021-04-09 DIAGNOSIS — E861 Hypovolemia: Secondary | ICD-10-CM | POA: Diagnosis not present

## 2021-04-09 DIAGNOSIS — E876 Hypokalemia: Secondary | ICD-10-CM | POA: Diagnosis not present

## 2021-04-09 DIAGNOSIS — E871 Hypo-osmolality and hyponatremia: Secondary | ICD-10-CM | POA: Diagnosis not present

## 2021-04-09 DIAGNOSIS — R109 Unspecified abdominal pain: Secondary | ICD-10-CM | POA: Diagnosis not present

## 2021-04-09 DIAGNOSIS — R112 Nausea with vomiting, unspecified: Secondary | ICD-10-CM | POA: Diagnosis not present

## 2021-04-09 DIAGNOSIS — K589 Irritable bowel syndrome without diarrhea: Secondary | ICD-10-CM | POA: Diagnosis not present

## 2021-04-12 DIAGNOSIS — E871 Hypo-osmolality and hyponatremia: Secondary | ICD-10-CM | POA: Diagnosis not present

## 2021-04-16 DIAGNOSIS — R300 Dysuria: Secondary | ICD-10-CM | POA: Diagnosis not present

## 2021-05-14 DIAGNOSIS — R300 Dysuria: Secondary | ICD-10-CM | POA: Diagnosis not present

## 2021-05-14 DIAGNOSIS — R399 Unspecified symptoms and signs involving the genitourinary system: Secondary | ICD-10-CM | POA: Diagnosis not present

## 2021-05-21 DIAGNOSIS — K588 Other irritable bowel syndrome: Secondary | ICD-10-CM | POA: Diagnosis not present

## 2021-05-21 DIAGNOSIS — R1033 Periumbilical pain: Secondary | ICD-10-CM | POA: Diagnosis not present

## 2021-05-21 DIAGNOSIS — F418 Other specified anxiety disorders: Secondary | ICD-10-CM | POA: Diagnosis not present

## 2021-05-21 DIAGNOSIS — R634 Abnormal weight loss: Secondary | ICD-10-CM | POA: Diagnosis not present

## 2021-06-08 DIAGNOSIS — N401 Enlarged prostate with lower urinary tract symptoms: Secondary | ICD-10-CM | POA: Diagnosis not present

## 2021-06-08 DIAGNOSIS — N433 Hydrocele, unspecified: Secondary | ICD-10-CM | POA: Diagnosis not present

## 2021-06-08 DIAGNOSIS — N503 Cyst of epididymis: Secondary | ICD-10-CM | POA: Diagnosis not present

## 2021-06-08 DIAGNOSIS — R351 Nocturia: Secondary | ICD-10-CM | POA: Diagnosis not present

## 2021-06-10 DIAGNOSIS — I10 Essential (primary) hypertension: Secondary | ICD-10-CM | POA: Diagnosis not present

## 2021-06-10 DIAGNOSIS — R531 Weakness: Secondary | ICD-10-CM | POA: Diagnosis not present

## 2021-06-10 DIAGNOSIS — T50905A Adverse effect of unspecified drugs, medicaments and biological substances, initial encounter: Secondary | ICD-10-CM | POA: Diagnosis not present

## 2021-06-10 DIAGNOSIS — T50995A Adverse effect of other drugs, medicaments and biological substances, initial encounter: Secondary | ICD-10-CM | POA: Diagnosis not present

## 2021-06-14 DIAGNOSIS — J029 Acute pharyngitis, unspecified: Secondary | ICD-10-CM | POA: Diagnosis not present

## 2021-06-14 DIAGNOSIS — Z20828 Contact with and (suspected) exposure to other viral communicable diseases: Secondary | ICD-10-CM | POA: Diagnosis not present

## 2021-06-15 DIAGNOSIS — R152 Fecal urgency: Secondary | ICD-10-CM | POA: Diagnosis not present

## 2021-06-15 DIAGNOSIS — K648 Other hemorrhoids: Secondary | ICD-10-CM | POA: Diagnosis not present

## 2021-06-15 DIAGNOSIS — K5900 Constipation, unspecified: Secondary | ICD-10-CM | POA: Diagnosis not present

## 2021-06-15 DIAGNOSIS — R109 Unspecified abdominal pain: Secondary | ICD-10-CM | POA: Diagnosis not present

## 2021-06-15 DIAGNOSIS — R159 Full incontinence of feces: Secondary | ICD-10-CM | POA: Diagnosis not present

## 2021-06-15 DIAGNOSIS — Z1211 Encounter for screening for malignant neoplasm of colon: Secondary | ICD-10-CM | POA: Diagnosis not present

## 2021-06-15 DIAGNOSIS — R1033 Periumbilical pain: Secondary | ICD-10-CM | POA: Diagnosis not present

## 2021-06-24 DIAGNOSIS — K588 Other irritable bowel syndrome: Secondary | ICD-10-CM | POA: Diagnosis not present

## 2021-06-24 DIAGNOSIS — F418 Other specified anxiety disorders: Secondary | ICD-10-CM | POA: Diagnosis not present

## 2021-07-15 DIAGNOSIS — R195 Other fecal abnormalities: Secondary | ICD-10-CM | POA: Diagnosis not present

## 2021-07-15 DIAGNOSIS — K581 Irritable bowel syndrome with constipation: Secondary | ICD-10-CM | POA: Diagnosis not present

## 2021-07-28 DIAGNOSIS — K581 Irritable bowel syndrome with constipation: Secondary | ICD-10-CM | POA: Diagnosis not present

## 2021-07-28 DIAGNOSIS — K5900 Constipation, unspecified: Secondary | ICD-10-CM | POA: Diagnosis not present

## 2021-07-28 DIAGNOSIS — R195 Other fecal abnormalities: Secondary | ICD-10-CM | POA: Diagnosis not present

## 2021-08-18 DIAGNOSIS — K5900 Constipation, unspecified: Secondary | ICD-10-CM | POA: Diagnosis not present

## 2021-08-18 DIAGNOSIS — R159 Full incontinence of feces: Secondary | ICD-10-CM | POA: Diagnosis not present

## 2021-09-15 DIAGNOSIS — K581 Irritable bowel syndrome with constipation: Secondary | ICD-10-CM | POA: Diagnosis not present

## 2021-09-15 DIAGNOSIS — K5900 Constipation, unspecified: Secondary | ICD-10-CM | POA: Diagnosis not present

## 2021-09-16 DIAGNOSIS — I10 Essential (primary) hypertension: Secondary | ICD-10-CM | POA: Diagnosis not present

## 2021-09-16 DIAGNOSIS — R634 Abnormal weight loss: Secondary | ICD-10-CM | POA: Diagnosis not present

## 2021-09-16 DIAGNOSIS — Z13220 Encounter for screening for lipoid disorders: Secondary | ICD-10-CM | POA: Insufficient documentation

## 2021-10-08 DIAGNOSIS — K625 Hemorrhage of anus and rectum: Secondary | ICD-10-CM | POA: Diagnosis not present

## 2021-10-11 DIAGNOSIS — K921 Melena: Secondary | ICD-10-CM | POA: Diagnosis not present

## 2021-11-02 DIAGNOSIS — K644 Residual hemorrhoidal skin tags: Secondary | ICD-10-CM | POA: Diagnosis not present

## 2021-11-02 DIAGNOSIS — G8929 Other chronic pain: Secondary | ICD-10-CM | POA: Insufficient documentation

## 2021-11-02 DIAGNOSIS — F459 Somatoform disorder, unspecified: Secondary | ICD-10-CM | POA: Insufficient documentation

## 2021-11-02 DIAGNOSIS — R634 Abnormal weight loss: Secondary | ICD-10-CM | POA: Diagnosis not present

## 2021-11-02 DIAGNOSIS — R109 Unspecified abdominal pain: Secondary | ICD-10-CM | POA: Diagnosis not present

## 2021-11-08 DIAGNOSIS — M6281 Muscle weakness (generalized): Secondary | ICD-10-CM | POA: Diagnosis not present

## 2021-11-08 DIAGNOSIS — R1032 Left lower quadrant pain: Secondary | ICD-10-CM | POA: Diagnosis not present

## 2021-11-08 DIAGNOSIS — R278 Other lack of coordination: Secondary | ICD-10-CM | POA: Diagnosis not present

## 2021-11-08 DIAGNOSIS — K5900 Constipation, unspecified: Secondary | ICD-10-CM | POA: Diagnosis not present

## 2021-12-06 DIAGNOSIS — R351 Nocturia: Secondary | ICD-10-CM | POA: Diagnosis not present

## 2021-12-06 DIAGNOSIS — N401 Enlarged prostate with lower urinary tract symptoms: Secondary | ICD-10-CM | POA: Diagnosis not present

## 2021-12-09 DIAGNOSIS — K5900 Constipation, unspecified: Secondary | ICD-10-CM | POA: Diagnosis not present

## 2021-12-09 DIAGNOSIS — R1032 Left lower quadrant pain: Secondary | ICD-10-CM | POA: Diagnosis not present

## 2021-12-09 DIAGNOSIS — M6281 Muscle weakness (generalized): Secondary | ICD-10-CM | POA: Diagnosis not present

## 2021-12-09 DIAGNOSIS — R1033 Periumbilical pain: Secondary | ICD-10-CM | POA: Diagnosis not present

## 2021-12-09 DIAGNOSIS — R278 Other lack of coordination: Secondary | ICD-10-CM | POA: Diagnosis not present

## 2022-03-21 DIAGNOSIS — R109 Unspecified abdominal pain: Secondary | ICD-10-CM | POA: Diagnosis not present

## 2022-03-21 DIAGNOSIS — R69 Illness, unspecified: Secondary | ICD-10-CM | POA: Diagnosis not present

## 2022-03-21 DIAGNOSIS — Z6821 Body mass index (BMI) 21.0-21.9, adult: Secondary | ICD-10-CM | POA: Diagnosis not present

## 2022-03-21 DIAGNOSIS — Z23 Encounter for immunization: Secondary | ICD-10-CM | POA: Diagnosis not present

## 2022-03-21 DIAGNOSIS — D649 Anemia, unspecified: Secondary | ICD-10-CM | POA: Diagnosis not present

## 2022-03-21 DIAGNOSIS — I10 Essential (primary) hypertension: Secondary | ICD-10-CM | POA: Diagnosis not present

## 2022-03-21 DIAGNOSIS — Z0001 Encounter for general adult medical examination with abnormal findings: Secondary | ICD-10-CM | POA: Diagnosis not present

## 2022-03-21 DIAGNOSIS — Z125 Encounter for screening for malignant neoplasm of prostate: Secondary | ICD-10-CM | POA: Diagnosis not present

## 2022-03-21 DIAGNOSIS — G8929 Other chronic pain: Secondary | ICD-10-CM | POA: Diagnosis not present

## 2022-03-21 DIAGNOSIS — R634 Abnormal weight loss: Secondary | ICD-10-CM | POA: Diagnosis not present

## 2022-03-22 DIAGNOSIS — D649 Anemia, unspecified: Secondary | ICD-10-CM | POA: Insufficient documentation

## 2024-01-26 ENCOUNTER — Ambulatory Visit: Payer: Self-pay

## 2024-01-26 NOTE — Telephone Encounter (Signed)
 FYI Only or Action Required?: FYI only for provider.  Patient was last seen in primary care on Not yet established.  Called Nurse Triage reporting Hypertension.  Symptoms began several weeks ago.  Interventions attempted: Prescription medications: Lisinopril 20MG  1x daily, also taking his mothers Lisinopril on 10MG .  Symptoms are: unchanged.  Triage Disposition: See Physician Within 24 Hours  Patient/caregiver understands and will follow disposition?: Yes   Reason for Disposition  Systolic BP >= 180 OR Diastolic >= 110  Answer Assessment - Initial Assessment Questions Patient states his mom doesn't take her Lisinopril anymore and patient has been taking his moms 10MG  Lisinopril along with his prescribed dose. Advised patient he should not be taking medication that is not prescribed to him. Patient's PCP is with [facility]. States main reason for calling was to establish care with a new doctor because his PCP is retiring. Advised UC for current symptoms and set him up with a new patient appointment.  1. BLOOD PRESSURE: What is your blood pressure? Did you take at least two measurements 5 minutes apart?     174/115, 176/116 2. ONSET: When did you take your blood pressure?     Less than an hour ago, states been high lately   3. HOW: How did you take your blood pressure? (e.g., automatic home BP monitor, visiting nurse)     Automatic BP monitor  4. HISTORY: Do you have a history of high blood pressure?     Controlled, but last month or two been higher.  5. MEDICINES: Are you taking any medicines for blood pressure? Have you missed any doses recently?     Lisinopril 20MG  1x daily   6. OTHER SYMPTOMS: Do you have any symptoms? (e.g., blurred vision, chest pain, difficulty breathing, headache, weakness)     Chest pains comes and goes past few days. States its in the center, slightly to the left, says it feels like a pound, like something is trying to bulge  out  Protocols used: Blood Pressure - High-A-AH

## 2024-01-26 NOTE — Telephone Encounter (Signed)
 Caller disconnected during transfer. RN attempted call back, phone rang and the stopped. Will place in callback for f/u  Copied from CRM #[id number]. Topic: Clinical - Red Word Triage >> Jan 26, 2024  4:17 PM Winona R wrote: BP 174/115

## 2024-01-31 ENCOUNTER — Ambulatory Visit: Payer: Self-pay

## 2024-01-31 VITALS — BP 138/82 | HR 68 | Temp 97.9°F | Resp 18 | Ht 66.0 in | Wt 140.0 lb

## 2024-01-31 DIAGNOSIS — R0789 Other chest pain: Secondary | ICD-10-CM | POA: Diagnosis not present

## 2024-01-31 DIAGNOSIS — Z13220 Encounter for screening for lipoid disorders: Secondary | ICD-10-CM | POA: Diagnosis not present

## 2024-01-31 DIAGNOSIS — R300 Dysuria: Secondary | ICD-10-CM | POA: Diagnosis not present

## 2024-01-31 DIAGNOSIS — I10 Essential (primary) hypertension: Secondary | ICD-10-CM | POA: Diagnosis not present

## 2024-01-31 DIAGNOSIS — Z8042 Family history of malignant neoplasm of prostate: Secondary | ICD-10-CM

## 2024-01-31 LAB — POCT URINALYSIS DIP (CLINITEK)
Bilirubin, UA: NEGATIVE
Blood, UA: NEGATIVE
Glucose, UA: NEGATIVE mg/dL
Ketones, POC UA: NEGATIVE mg/dL
Leukocytes, UA: NEGATIVE
Nitrite, UA: NEGATIVE
POC PROTEIN,UA: NEGATIVE
Spec Grav, UA: <=1.005 — AB (ref 1.010–1.025)
Urobilinogen, UA: 0.2 U/dL
pH, UA: 6 (ref 5.0–8.0)

## 2024-01-31 MED ORDER — LISINOPRIL 40 MG PO TABS
40.0000 mg | ORAL_TABLET | Freq: Every day | ORAL | 3 refills | Status: AC
Start: 2024-01-31 — End: ?

## 2024-01-31 NOTE — Assessment & Plan Note (Signed)
 Complains of dysuria but his urine looks completely normal. Reassurance provided

## 2024-01-31 NOTE — Assessment & Plan Note (Signed)
 Blood pressure readings consistently high at HOME REPORTEDLY AT 183/114 and 190/110. Chest pain for one month, left-sided, worsens with physical activity. Normal EKG unchanged from 2020, indicating no acute cardiac event. Current lisinopril dose of 30 mg (20 mg prescribed plus 10 mg from mother's supply) insufficient for blood pressure control. No urinary infection, but kidney function evaluation needed for potential hypertension-related damage. - Increase lisinopril to 40 mg daily. - Check blood pressure daily in the evening after resting. - Maintain a log of blood pressure readings for one week and drop off at the office. - Order blood work to assess kidney function, blood counts, cholesterol, and prostate cancer screening. - Consider adding another antihypertensive medication if blood pressure remains high after one week on increased lisinopril. - Advise emergency room visit for severe chest pain or significantly elevated blood pressure. - Recheck blood pressure in office.

## 2024-01-31 NOTE — Patient Instructions (Signed)
  VISIT SUMMARY: You visited us  today due to high blood pressure and chest pain. We discussed your current medications and made some adjustments to better manage your blood pressure. We also talked about your general health and the importance of maintaining regular meals despite your caregiving responsibilities.  YOUR PLAN: HYPERTENSION WITH CHEST PAIN: Your blood pressure has been consistently high, and you've been experiencing chest pain for the past month. Your current medication dosage is not effectively controlling your blood pressure. -Increase lisinopril to 40 mg daily. -Check your blood pressure daily in the evening after resting. -Keep a log of your blood pressure readings for one week and drop it off at the office. -We will do blood work to check your kidney function, blood counts, cholesterol, and screen for prostate cancer. -If your blood pressure remains high after one week on the increased lisinopril, we may add another blood pressure medication. -Go to the emergency room if you experience severe chest pain or if your blood pressure is significantly elevated. -We will recheck your blood pressure in the office.  GENERAL HEALTH MAINTENANCE: We discussed the importance of maintaining regular meals and avoiding tobacco and alcohol for your overall health. -Encourage regular meals for better health. -Continue to avoid tobacco and alcohol.                      Contains text generated by [employer].                                 Contains text generated by [employer].

## 2024-01-31 NOTE — Assessment & Plan Note (Signed)
 Reports chest pain on left side for MORE THAN A MONTH. Chest pain NOT reproducible on palpation. Moderate cardiac risk with uncontrolled HTN and age.,  EKG done in office today, as interpreted by me showed NORMAL SINUS RHYTHM, normal axis, vent rate of 61, Normal PR, QRS and Qtc intervals.No ST or T wave changes.  The EKG has remained the same as EKG from 2020.  Still, advised to call 911 if any severe chest pain.  Etiology- less likely cardiac, unsure if related to elevated BP readings.   Will consider stress test if he continues to complain of chest pain, but he had stress test and cardiac cath in 2020 which was clean.

## 2024-01-31 NOTE — Progress Notes (Signed)
 New Patient Office Visit  Subjective    Patient ID: Brandon Love, male    DOB: Sep 18, 1960  Age: 63 y.o. MRN: [id number]  CC:  Chief Complaint  Patient presents with   Establish Care   Hypertension    HPI Brandon Love presents to establish care. Patient has hypertension.  Outpatient Encounter Medications as of 01/31/2024  Medication Sig   fluticasone (FLONASE) 50 MCG/ACT nasal spray Place 2 sprays into both nostrils daily as needed for allergies.    lisinopril (ZESTRIL) 40 MG tablet Take 1 tablet (40 mg total) by mouth daily.   Magnesium Oxide 250 MG TABS Take 500 mg by mouth at bedtime.    Omega-3 Fatty Acids (FISH OIL) 1000 MG CAPS Take 2,000 mg by mouth 2 (two) times daily.    [DISCONTINUED] Garlic 1000 MG CAPS Take 1,000 mg by mouth daily.    [DISCONTINUED] lisinopril (ZESTRIL) 20 MG tablet Take 20 mg by mouth daily.   aspirin  EC 81 MG tablet Take 1 tablet (81 mg total) by mouth daily. (Patient not taking: Reported on 01/31/2024)   nitroGLYCERIN  (NITROSTAT ) 0.4 MG SL tablet Place 1 tablet (0.4 mg total) under the tongue every 5 (five) minutes as needed for chest pain. (Patient not taking: Reported on 01/31/2024)   No facility-administered encounter medications on file as of 01/31/2024.    Past Medical History:  Diagnosis Date   Hypertension     Past Surgical History:  Procedure Laterality Date   APPENDECTOMY     HEMORRHOID SURGERY     INGUINAL HERNIA REPAIR     LEFT HEART CATH AND CORONARY ANGIOGRAPHY N/A 04/18/2019   Procedure: LEFT HEART CATH AND CORONARY ANGIOGRAPHY;  Surgeon: Wonda Sharper, MD;  Location: [facility] INVASIVE CV LAB;  Service: Cardiovascular;  Laterality: N/A;    Family History  Problem Relation Age of Onset   Headache Mother    Prostate cancer Father     Social History   Socioeconomic History   Marital status: Single    Spouse name: Not on file   Number of children: Not on file   Years of education: Not on file   Highest education level: Not  on file  Occupational History   Not on file  Tobacco Use   Smoking status: Never   Smokeless tobacco: Never  Vaping Use   Vaping status: Never Used  Substance and Sexual Activity   Alcohol use: Not Currently   Drug use: Never   Sexual activity: Not Currently  Other Topics Concern   Not on file  Social History Narrative   Not on file   Social Drivers of Health   Financial Resource Strain: Not on file  Food Insecurity: Not on file  Transportation Needs: Not on file  Physical Activity: Not on file  Stress: Not on file  Social Connections: Not on file  Intimate Partner Violence: Not on file    Review of Systems  Constitutional:  Negative for chills, fever and malaise/fatigue.  HENT:  Negative for congestion, ear pain, sinus pain and sore throat.   Eyes: Negative.   Respiratory:  Negative for cough, shortness of breath and wheezing.   Cardiovascular:  Positive for chest pain. Negative for palpitations and leg swelling.  Gastrointestinal:  Positive for constipation. Negative for diarrhea, nausea and vomiting.  Genitourinary:  Positive for dysuria. Negative for frequency and urgency.  Musculoskeletal: Negative.   Skin: Negative.   Neurological:  Negative for dizziness and headaches.  Endo/Heme/Allergies: Negative.   Psychiatric/Behavioral:  Negative.          Objective    BP 138/82   Pulse 68   Temp 97.9 F (36.6 C) (Temporal)   Resp 18   Ht 5' 6 (1.676 m)   Wt 140 lb (63.5 kg)   SpO2 97%   BMI 22.60 kg/m   Physical Exam Vitals and nursing note reviewed.  Constitutional:      Appearance: Normal appearance.  HENT:     Head: Normocephalic and atraumatic.     Right Ear: Tympanic membrane normal.     Left Ear: Tympanic membrane normal.     Nose: Nose normal.  Eyes:     Pupils: Pupils are equal, round, and reactive to light.  Cardiovascular:     Rate and Rhythm: Normal rate and regular rhythm.  Pulmonary:     Effort: Pulmonary effort is normal.     Breath  sounds: Normal breath sounds.  Chest:     Chest wall: No tenderness.  Abdominal:     General: Abdomen is flat.  Musculoskeletal:        General: Normal range of motion.  Skin:    General: Skin is warm.  Neurological:     General: No focal deficit present.     Mental Status: He is alert and oriented to person, place, and time.  Psychiatric:        Mood and Affect: Mood normal.        Behavior: Behavior normal.         Assessment & Plan:   Problem List Items Addressed This Visit     Chest discomfort   Reports chest pain on left side for MORE THAN A MONTH. Chest pain NOT reproducible on palpation. Moderate cardiac risk with uncontrolled HTN and age.,  EKG done in office today, as interpreted by me showed NORMAL SINUS RHYTHM, normal axis, vent rate of 61, Normal PR, QRS and Qtc intervals.No ST or T wave changes.  The EKG has remained the same as EKG from 2020.  Still, advised to call 911 if any severe chest pain.  Etiology- less likely cardiac, unsure if related to elevated BP readings.   Will consider stress test if he continues to complain of chest pain, but he had stress test and cardiac cath in 2020 which was clean.       Relevant Orders   CBC with Differential/Platelet   Uncontrolled hypertension, stage 1 - Primary    Blood pressure readings consistently high at HOME REPORTEDLY AT 183/114 and 190/110. Chest pain for one month, left-sided, worsens with physical activity. Normal EKG unchanged from 2020, indicating no acute cardiac event. Current lisinopril dose of 30 mg (20 mg prescribed plus 10 mg from mother's supply) insufficient for blood pressure control. No urinary infection, but kidney function evaluation needed for potential hypertension-related damage. - Increase lisinopril to 40 mg daily. - Check blood pressure daily in the evening after resting. - Maintain a log of blood pressure readings for one week and drop off at the office. - Order blood work to assess  kidney function, blood counts, cholesterol, and prostate cancer screening. - Consider adding another antihypertensive medication if blood pressure remains high after one week on increased lisinopril. - Advise emergency room visit for severe chest pain or significantly elevated blood pressure. - Recheck blood pressure in office.      Relevant Medications   lisinopril (ZESTRIL) 40 MG tablet   Other Relevant Orders   CBC with Differential/Platelet   Comprehensive metabolic  panel with GFR   Microalbumin / creatinine urine ratio   Screening for lipid disorders   Relevant Orders   Lipid panel   Family history of prostate cancer in father   Relevant Orders   PSA   Dysuria   Complains of dysuria but his urine looks completely normal. Reassurance provided      Relevant Orders   POCT URINALYSIS DIP (CLINITEK) (Completed)    General Health Maintenance Discussed importance of regular meals despite caregiving responsibilities. No tobacco or alcohol use. Family history of prostate cancer noted. - Encourage regular meals for better health. - Continue to avoid tobacco and alcohol.        Return in about 8 weeks (around 03/27/2024) for chronic disease follow up.   Svetlana Bagby, MD

## 2024-02-01 LAB — CBC WITH DIFFERENTIAL/PLATELET
Basophils Absolute: 0 x10E3/uL (ref 0.0–0.2)
Basos: 1 %
EOS (ABSOLUTE): 0.1 x10E3/uL (ref 0.0–0.4)
Eos: 3 %
Hematocrit: 42.9 % (ref 37.5–51.0)
Hemoglobin: 14.1 g/dL (ref 13.0–17.7)
Immature Grans (Abs): 0 x10E3/uL (ref 0.0–0.1)
Immature Granulocytes: 0 %
Lymphocytes Absolute: 1.1 x10E3/uL (ref 0.7–3.1)
Lymphs: 32 %
MCH: 31 pg (ref 26.6–33.0)
MCHC: 32.9 g/dL (ref 31.5–35.7)
MCV: 94 fL (ref 79–97)
Monocytes Absolute: 0.4 x10E3/uL (ref 0.1–0.9)
Monocytes: 12 %
Neutrophils Absolute: 1.7 x10E3/uL (ref 1.4–7.0)
Neutrophils: 52 %
Platelets: 223 x10E3/uL (ref 150–450)
RBC: 4.55 x10E6/uL (ref 4.14–5.80)
RDW: 13.5 % (ref 11.6–15.4)
WBC: 3.3 x10E3/uL — ABNORMAL LOW (ref 3.4–10.8)

## 2024-02-01 LAB — COMPREHENSIVE METABOLIC PANEL WITH GFR
ALT: 17 IU/L (ref 0–44)
AST: 21 IU/L (ref 0–40)
Albumin: 4.3 g/dL (ref 3.9–4.9)
Alkaline Phosphatase: 100 IU/L (ref 47–123)
BUN/Creatinine Ratio: 7 — ABNORMAL LOW (ref 10–24)
BUN: 6 mg/dL — ABNORMAL LOW (ref 8–27)
Bilirubin Total: 0.6 mg/dL (ref 0.0–1.2)
CO2: 25 mmol/L (ref 20–29)
Calcium: 9.5 mg/dL (ref 8.6–10.2)
Chloride: 102 mmol/L (ref 96–106)
Creatinine, Ser: 0.82 mg/dL (ref 0.76–1.27)
Globulin, Total: 2.6 g/dL (ref 1.5–4.5)
Glucose: 89 mg/dL (ref 70–99)
Potassium: 4.2 mmol/L (ref 3.5–5.2)
Sodium: 140 mmol/L (ref 134–144)
Total Protein: 6.9 g/dL (ref 6.0–8.5)
eGFR: 97 mL/min/1.73 (ref >59)

## 2024-02-01 LAB — MICROALBUMIN / CREATININE URINE RATIO
Creatinine, Urine: 20.4 mg/dL
Microalb/Creat Ratio: 95 mg/g{creat} — ABNORMAL HIGH (ref 0–29)
Microalbumin, Urine: 19.4 ug/mL

## 2024-02-01 LAB — LIPID PANEL
Chol/HDL Ratio: 3 ratio (ref 0.0–5.0)
Cholesterol, Total: 161 mg/dL (ref 100–199)
HDL: 53 mg/dL (ref >39)
LDL Chol Calc (NIH): 97 mg/dL (ref 0–99)
Triglycerides: 53 mg/dL (ref 0–149)
VLDL Cholesterol Cal: 11 mg/dL (ref 5–40)

## 2024-02-01 LAB — PSA: Prostate Specific Ag, Serum: 0.9 ng/mL (ref 0.0–4.0)

## 2024-02-05 ENCOUNTER — Ambulatory Visit: Payer: Self-pay

## 2024-02-06 NOTE — Progress Notes (Signed)
 Could you please inform the patient that his blood work showed slightly lower than normal white blood cells which should be of no concern but we could monitor. Otherwise, kidney and liver numbers are normal. Cholesterol panel is normal, PSA is normal.   He is leaking a small amount of protein in his urine, which could be from the high BP. Since I had prescribed LISINOPRIL 40 MG DAILY for him, this should improve. We will monitor this.  Thanks, Sabryn Preslar MD

## 2024-02-07 ENCOUNTER — Telehealth: Payer: Self-pay

## 2024-02-07 NOTE — Telephone Encounter (Signed)
 Copied from CRM #[id number]. Topic: General - Other >> Feb 07, 2024  4:33 PM Lauren C wrote: Reason for CRM: Pt states he was supposed to give us  a call with his blood pressure log since his last appointment. Blood pressures and pulses are as follows:   10/16- 130/81 p73 10/17- 143/80 p 67 10/18 153/89 p 71 10/19 139/80 p 70 10/20 144/79 p 71 10/21 134/85 p 70 10/22 138/85 p 65

## 2024-02-08 NOTE — Telephone Encounter (Signed)
 Left detailed message with recommendations. Patient to call back with any questions.

## 2024-02-16 ENCOUNTER — Telehealth: Payer: Self-pay

## 2024-02-16 NOTE — Telephone Encounter (Signed)
 Left message informing patient of your recommendation.

## 2024-02-16 NOTE — Telephone Encounter (Signed)
 Copied from CRM [id number]. Topic: General - Other >> Feb 16, 2024  8:11 AM Robinson H wrote: Reason for CRM: Patient calling in to provide blood pressure results for provider 10/23 138/80 pulse 68, 10/24 131/85 pulse 69, 10/25 137/77 pulse 68, 10/26 149/81 pulse 72, 10/27 143/84 pulse 68, 10/28 142/81 pulse 69, 10/29 142/80 pulse 70.  Fleeta [phone]

## 2024-02-23 ENCOUNTER — Telehealth: Payer: Self-pay

## 2024-02-23 NOTE — Telephone Encounter (Signed)
 Copied from CRM #[id number]. Topic: General - Other >> Feb 23, 2024  8:19 AM Pinkey ORN wrote: Reason for CRM: Sirivol, Mamatha, MD >> Feb 23, 2024  8:22 AM Pinkey ORN wrote: Patient's Blood Pressure Report   02/15/24 - 141/81 pulse 71 02/16/24 - 141/81 pulse 72 02/17/24 - 133/80 pulse 68 02/18/24 - 139/81 pulse 68  02/19/24 - 139/79 pulse 68 02/20/24 - 137/79 pulse 69  02/21/24 - 135/83 pulse 67

## 2024-02-26 NOTE — Telephone Encounter (Signed)
 Left detailed message with info.

## 2024-02-29 ENCOUNTER — Telehealth: Payer: Self-pay

## 2024-02-29 NOTE — Telephone Encounter (Signed)
 Copied from CRM [id number]. Topic: Clinical - Medical Advice >> Feb 29, 2024  8:40 AM Ivette P wrote: Reason for CRM: Blood Pressure Readings for 11/06-11/12   11/06 - 136/85  pulse 68  11/07- 127/73 pulse 64 11/08 130/75 pulse 68 11/09 145/87 pulse 70 11/10 121/75 pulse 69 11/11 129/78 pulse 67 11/12 135/83 pulse 70 11/13- has not taken for today.   Pt called in to leave report of weeks blood pressure reading for his primary sirivol

## 2024-02-29 NOTE — Telephone Encounter (Signed)
 Left detailed message for patient to return call if have any questions or concerns.

## 2024-03-28 ENCOUNTER — Ambulatory Visit
